# Patient Record
Sex: Male | Born: 1955 | Race: White | Hispanic: No | Marital: Married | Smoking: Former smoker
Health system: Southern US, Community
[De-identification: ages and names within clinical notes are randomized; demographics above are authoritative.]

## PROBLEM LIST (undated history)

## (undated) DIAGNOSIS — I10 Essential (primary) hypertension: Secondary | ICD-10-CM

## (undated) DIAGNOSIS — M1990 Unspecified osteoarthritis, unspecified site: Secondary | ICD-10-CM

## (undated) DIAGNOSIS — K219 Gastro-esophageal reflux disease without esophagitis: Secondary | ICD-10-CM

## (undated) HISTORY — PX: TONSILLECTOMY: SUR1361

## (undated) HISTORY — PX: JOINT REPLACEMENT: SHX530

---

## 1984-04-16 HISTORY — PX: TOE SURGERY: SHX1073

## 2015-05-26 ENCOUNTER — Other Ambulatory Visit: Payer: Self-pay | Admitting: Orthopedic Surgery

## 2015-05-26 DIAGNOSIS — M170 Bilateral primary osteoarthritis of knee: Secondary | ICD-10-CM

## 2015-06-02 ENCOUNTER — Ambulatory Visit
Admission: RE | Admit: 2015-06-02 | Discharge: 2015-06-02 | Disposition: A | Discharge status: Home / Self Care | Payer: BLUE CROSS/BLUE SHIELD | Source: Ambulatory Visit | Attending: Orthopedic Surgery | Admitting: Orthopedic Surgery

## 2015-06-02 DIAGNOSIS — M170 Bilateral primary osteoarthritis of knee: Secondary | ICD-10-CM

## 2015-06-02 DIAGNOSIS — M1711 Unilateral primary osteoarthritis, right knee: Secondary | ICD-10-CM | POA: Insufficient documentation

## 2015-07-06 ENCOUNTER — Encounter
Admission: RE | Admit: 2015-07-06 | Discharge: 2015-07-06 | Disposition: A | Discharge status: Home / Self Care | Payer: BLUE CROSS/BLUE SHIELD | Source: Ambulatory Visit | Attending: Orthopedic Surgery | Admitting: Orthopedic Surgery

## 2015-07-06 DIAGNOSIS — Z01812 Encounter for preprocedural laboratory examination: Secondary | ICD-10-CM | POA: Diagnosis not present

## 2015-07-06 DIAGNOSIS — Z01810 Encounter for preprocedural cardiovascular examination: Secondary | ICD-10-CM | POA: Insufficient documentation

## 2015-07-06 LAB — BASIC METABOLIC PANEL
Anion gap: 9 (ref 5–15)
BUN: 13 mg/dL (ref 6–20)
CALCIUM: 9.1 mg/dL (ref 8.9–10.3)
CO2: 24 mmol/L (ref 22–32)
CREATININE: 1.22 mg/dL (ref 0.61–1.24)
Chloride: 107 mmol/L (ref 101–111)
GFR calc Af Amer: >60 mL/min (ref >60)
Glucose, Bld: 114 mg/dL — ABNORMAL HIGH (ref 65–99)
POTASSIUM: 3.6 mmol/L (ref 3.5–5.1)
SODIUM: 140 mmol/L (ref 135–145)

## 2015-07-06 LAB — URINALYSIS COMPLETE WITH MICROSCOPIC (ARMC ONLY)
BACTERIA UA: NONE SEEN
Bilirubin Urine: NEGATIVE
Glucose, UA: NEGATIVE mg/dL
HGB URINE DIPSTICK: NEGATIVE
Leukocytes, UA: NEGATIVE
Nitrite: NEGATIVE
PH: 5 (ref 5.0–8.0)
PROTEIN: NEGATIVE mg/dL
SQUAMOUS EPITHELIAL / LPF: NONE SEEN
Specific Gravity, Urine: 1.025 (ref 1.005–1.030)

## 2015-07-06 LAB — CBC
HCT: 43.2 % (ref 40.0–52.0)
HEMOGLOBIN: 14.2 g/dL (ref 13.0–18.0)
MCH: 27.9 pg (ref 26.0–34.0)
MCHC: 32.8 g/dL (ref 32.0–36.0)
MCV: 85 fL (ref 80.0–100.0)
PLATELETS: 253 10*3/uL (ref 150–440)
RBC: 5.08 MIL/uL (ref 4.40–5.90)
RDW: 12.9 % (ref 11.5–14.5)
WBC: 7.2 10*3/uL (ref 3.8–10.6)

## 2015-07-06 LAB — TYPE AND SCREEN
ABO/RH(D): O NEG
ANTIBODY SCREEN: NEGATIVE

## 2015-07-06 LAB — APTT: aPTT: 31 seconds (ref 24–36)

## 2015-07-06 LAB — SURGICAL PCR SCREEN
MRSA, PCR: NEGATIVE
STAPHYLOCOCCUS AUREUS: POSITIVE — AB

## 2015-07-06 LAB — SEDIMENTATION RATE: SED RATE: 4 mm/h (ref 0–20)

## 2015-07-06 LAB — PROTIME-INR
INR: 0.96
PROTHROMBIN TIME: 13 s (ref 11.4–15.0)

## 2015-07-06 NOTE — Patient Instructions (Signed)
  Your procedure is scheduled on: Tuesday Sept. 6, 2016 Report to Same Day Surgery. To find out your arrival time please call [phone] between 1PM - 3PM on Friday Sept. 2, 2016.  Remember: Instructions that are not followed completely may result in serious medical risk, up to and including death, or upon the discretion of your surgeon and anesthesiologist your surgery may need to be rescheduled.    _x___ 1. Do not eat food or drink liquids after midnight. No gum chewing or hard candies.     _x___ 2. No Alcohol for 24 hours before or after surgery.   ____ 3. Bring all medications with you on the day of surgery if instructed.    _x___ 4. Notify your doctor if there is any change in your medical condition     (cold, fever, infections).     Do not wear jewelry, make-up, hairpins, clips or nail polish.  Do not wear lotions, powders, or perfumes. You may wear deodorant.  Do not shave 48 hours prior to surgery. Men may shave face and neck.  Do not bring valuables to the hospital.    [facility] is not responsible for any belongings or valuables.               Contacts, dentures or bridgework may not be worn into surgery.  Leave your suitcase in the car. After surgery it may be brought to your room.  For patients admitted to the hospital, discharge time is determined by your treatment team.   Patients discharged the day of surgery will not be allowed to drive home.    Please read over the following fact sheets that you were given:   [facility] Preparing for Surgery  __x__ Take these medicines the morning of surgery with A SIP OF WATER:    1. amLODipine (NORVASC   ____ Fleet Enema (as directed)   _x___ Use CHG Soap as directed  ____ Use inhalers on the day of surgery  ____ Stop metformin 2 days prior to surgery    ____ Take 1/2 of usual insulin dose the night before surgery and none on the morning of surgery.   ____ Stop Coumadin/Plavix/aspirin on does not apply.  __x_  Stop Anti-inflammatories aleve now.  May continue taking Celebrex.   ____ Stop supplements until after surgery.    ____ Bring C-Pap to the hospital.

## 2015-07-07 LAB — ABO/RH: ABO/RH(D): O NEG

## 2015-07-07 NOTE — OR Nursing (Signed)
Postive staph faxed to dr Rosita Kea office

## 2015-07-08 LAB — URINE CULTURE: CULTURE: NO GROWTH

## 2015-07-12 ENCOUNTER — Inpatient Hospital Stay: Payer: BLUE CROSS/BLUE SHIELD | Admitting: Certified Registered Nurse Anesthetist

## 2015-07-12 ENCOUNTER — Inpatient Hospital Stay: Payer: BLUE CROSS/BLUE SHIELD

## 2015-07-12 ENCOUNTER — Encounter
Admission: RE | Disposition: A | Discharge status: Home Health | Payer: Self-pay | Source: Ambulatory Visit | Attending: Orthopedic Surgery

## 2015-07-12 ENCOUNTER — Inpatient Hospital Stay
Admission: RE | Admit: 2015-07-12 | Discharge: 2015-07-15 | DRG: 470 | Disposition: A | Discharge status: Home Health | Payer: BLUE CROSS/BLUE SHIELD | Source: Ambulatory Visit | Attending: Orthopedic Surgery | Admitting: Orthopedic Surgery

## 2015-07-12 DIAGNOSIS — R531 Weakness: Secondary | ICD-10-CM | POA: Diagnosis not present

## 2015-07-12 DIAGNOSIS — Y92239 Unspecified place in hospital as the place of occurrence of the external cause: Secondary | ICD-10-CM

## 2015-07-12 DIAGNOSIS — T4145XA Adverse effect of unspecified anesthetic, initial encounter: Secondary | ICD-10-CM | POA: Diagnosis not present

## 2015-07-12 DIAGNOSIS — I10 Essential (primary) hypertension: Secondary | ICD-10-CM | POA: Diagnosis present

## 2015-07-12 DIAGNOSIS — I959 Hypotension, unspecified: Secondary | ICD-10-CM

## 2015-07-12 DIAGNOSIS — R61 Generalized hyperhidrosis: Secondary | ICD-10-CM | POA: Diagnosis not present

## 2015-07-12 DIAGNOSIS — K219 Gastro-esophageal reflux disease without esophagitis: Secondary | ICD-10-CM | POA: Diagnosis present

## 2015-07-12 DIAGNOSIS — G8918 Other acute postprocedural pain: Secondary | ICD-10-CM

## 2015-07-12 DIAGNOSIS — M1710 Unilateral primary osteoarthritis, unspecified knee: Secondary | ICD-10-CM | POA: Diagnosis present

## 2015-07-12 DIAGNOSIS — I952 Hypotension due to drugs: Secondary | ICD-10-CM | POA: Diagnosis not present

## 2015-07-12 DIAGNOSIS — Z79899 Other long term (current) drug therapy: Secondary | ICD-10-CM | POA: Diagnosis not present

## 2015-07-12 DIAGNOSIS — M1711 Unilateral primary osteoarthritis, right knee: Principal | ICD-10-CM | POA: Diagnosis present

## 2015-07-12 HISTORY — PX: TOTAL KNEE ARTHROPLASTY: SHX125

## 2015-07-12 HISTORY — DX: Essential (primary) hypertension: I10

## 2015-07-12 HISTORY — DX: Unspecified osteoarthritis, unspecified site: M19.90

## 2015-07-12 HISTORY — DX: Gastro-esophageal reflux disease without esophagitis: K21.9

## 2015-07-12 LAB — CBC
HEMATOCRIT: 39.4 % — AB (ref 40.0–52.0)
HEMOGLOBIN: 13.3 g/dL (ref 13.0–18.0)
MCH: 29.1 pg (ref 26.0–34.0)
MCHC: 33.8 g/dL (ref 32.0–36.0)
MCV: 86.1 fL (ref 80.0–100.0)
Platelets: 219 10*3/uL (ref 150–440)
RBC: 4.57 MIL/uL (ref 4.40–5.90)
RDW: 13.3 % (ref 11.5–14.5)
WBC: 11.3 10*3/uL — AB (ref 3.8–10.6)

## 2015-07-12 LAB — CREATININE, SERUM
Creatinine, Ser: 0.99 mg/dL (ref 0.61–1.24)
GFR calc non Af Amer: >60 mL/min (ref >60)

## 2015-07-12 SURGERY — ARTHROPLASTY, KNEE, TOTAL
Anesthesia: Spinal | Site: Knee | Laterality: Right | Wound class: Clean

## 2015-07-12 MED ORDER — CEFAZOLIN SODIUM-DEXTROSE 2-3 GM-% IV SOLR
INTRAVENOUS | Status: AC
  Filled 2015-07-12: qty 50

## 2015-07-12 MED ORDER — DIPHENHYDRAMINE HCL 12.5 MG/5ML PO ELIX: 12.5000 mg | ORAL_SOLUTION | ORAL | Status: DC | PRN

## 2015-07-12 MED ORDER — NEOMYCIN-POLYMYXIN B GU 40-200000 IR SOLN
Status: AC
  Filled 2015-07-12: qty 12

## 2015-07-12 MED ORDER — FAMOTIDINE 20 MG PO TABS
20.0000 mg | ORAL_TABLET | Freq: Once | ORAL | Status: AC
  Administered 2015-07-12: 20 mg via ORAL

## 2015-07-12 MED ORDER — CELECOXIB 200 MG PO CAPS
200.0000 mg | ORAL_CAPSULE | Freq: Every day | ORAL | Status: DC
  Administered 2015-07-13 – 2015-07-15 (×3): 200 mg via ORAL
  Filled 2015-07-12 (×3): qty 1

## 2015-07-12 MED ORDER — PROPOFOL INFUSION 10 MG/ML OPTIME
INTRAVENOUS | Status: DC | PRN
  Administered 2015-07-12: 100 ug/kg/min via INTRAVENOUS

## 2015-07-12 MED ORDER — KETOROLAC TROMETHAMINE 0.5 % OP SOLN
1.0000 [drp] | Freq: Once | OPHTHALMIC | Status: AC
  Administered 2015-07-12: 1 [drp] via OPHTHALMIC

## 2015-07-12 MED ORDER — FENTANYL CITRATE (PF) 100 MCG/2ML IJ SOLN: 25.0000 ug | INTRAMUSCULAR | Status: DC | PRN

## 2015-07-12 MED ORDER — SODIUM CHLORIDE 0.9 % IV BOLUS (SEPSIS)
1000.0000 mL | Freq: Once | INTRAVENOUS | Status: AC
  Administered 2015-07-12: 1000 mL via INTRAVENOUS

## 2015-07-12 MED ORDER — BUPIVACAINE LIPOSOME 1.3 % IJ SUSP
INTRAMUSCULAR | Status: AC
  Filled 2015-07-12: qty 20

## 2015-07-12 MED ORDER — SODIUM CHLORIDE 0.9 % IV SOLN
INTRAVENOUS | Status: DC
  Administered 2015-07-12: 12:00:00 via INTRAVENOUS

## 2015-07-12 MED ORDER — MORPHINE SULFATE (PF) 10 MG/ML IV SOLN
INTRAVENOUS | Status: AC
  Filled 2015-07-12: qty 1

## 2015-07-12 MED ORDER — TETRACAINE HCL 1 % IJ SOLN
INTRAMUSCULAR | Status: DC | PRN
  Administered 2015-07-12: 5 mg via INTRASPINAL

## 2015-07-12 MED ORDER — BUPIVACAINE HCL (PF) 0.5 % IJ SOLN
INTRAMUSCULAR | Status: DC | PRN
Start: 2015-07-12 — End: 2015-07-12
  Administered 2015-07-12: 3 mL

## 2015-07-12 MED ORDER — CEFAZOLIN SODIUM-DEXTROSE 2-3 GM-% IV SOLR
2.0000 g | Freq: Once | INTRAVENOUS | Status: AC
  Administered 2015-07-12: 2 g via INTRAVENOUS

## 2015-07-12 MED ORDER — ONDANSETRON HCL 4 MG/2ML IJ SOLN: 4.0000 mg | Freq: Four times a day (QID) | INTRAMUSCULAR | Status: DC | PRN

## 2015-07-12 MED ORDER — BUPIVACAINE HCL (PF) 0.5 % IJ SOLN
INTRAMUSCULAR | Status: AC
  Filled 2015-07-12: qty 30

## 2015-07-12 MED ORDER — NEOMYCIN-POLYMYXIN B GU 40-200000 IR SOLN
Status: DC | PRN
  Administered 2015-07-12: 16 mL

## 2015-07-12 MED ORDER — SODIUM CHLORIDE 0.9 % IV SOLN
INTRAVENOUS | Status: DC
  Administered 2015-07-12 – 2015-07-13 (×2): via INTRAVENOUS

## 2015-07-12 MED ORDER — MAGNESIUM CITRATE PO SOLN: 1.0000 | Freq: Once | ORAL | Status: DC | PRN

## 2015-07-12 MED ORDER — MENTHOL 3 MG MT LOZG: 1.0000 | LOZENGE | OROMUCOSAL | Status: DC | PRN

## 2015-07-12 MED ORDER — FENTANYL CITRATE (PF) 100 MCG/2ML IJ SOLN
INTRAMUSCULAR | Status: DC | PRN
  Administered 2015-07-12 (×2): 50 ug via INTRAVENOUS

## 2015-07-12 MED ORDER — CEFAZOLIN SODIUM-DEXTROSE 2-3 GM-% IV SOLR
2.0000 g | Freq: Four times a day (QID) | INTRAVENOUS | Status: AC
  Administered 2015-07-12 (×3): 2 g via INTRAVENOUS
  Filled 2015-07-12 (×3): qty 50

## 2015-07-12 MED ORDER — BISACODYL 10 MG RE SUPP: 10.0000 mg | Freq: Every day | RECTAL | Status: DC | PRN

## 2015-07-12 MED ORDER — FAMOTIDINE 20 MG PO TABS
ORAL_TABLET | ORAL | Status: AC
  Filled 2015-07-12: qty 1

## 2015-07-12 MED ORDER — ENOXAPARIN SODIUM 30 MG/0.3ML ~~LOC~~ SOLN
30.0000 mg | Freq: Two times a day (BID) | SUBCUTANEOUS | Status: DC
Start: 2015-07-13 — End: 2015-07-15
  Administered 2015-07-13 – 2015-07-15 (×5): 30 mg via SUBCUTANEOUS
  Filled 2015-07-12 (×5): qty 0.3

## 2015-07-12 MED ORDER — SODIUM CHLORIDE 0.9 % IJ SOLN
INTRAMUSCULAR | Status: AC
  Filled 2015-07-12: qty 50

## 2015-07-12 MED ORDER — ACETAMINOPHEN 325 MG PO TABS: 650.0000 mg | ORAL_TABLET | Freq: Four times a day (QID) | ORAL | Status: DC | PRN

## 2015-07-12 MED ORDER — SODIUM CHLORIDE 0.9 % IV SOLN
1000.0000 mg | INTRAVENOUS | Status: AC
  Administered 2015-07-12: 1000 mg via INTRAVENOUS
  Filled 2015-07-12 (×2): qty 10

## 2015-07-12 MED ORDER — SODIUM CHLORIDE 0.9 % IR SOLN
Status: DC | PRN
  Administered 2015-07-12: 3000 mL

## 2015-07-12 MED ORDER — ONDANSETRON HCL 4 MG/2ML IJ SOLN: 4.0000 mg | Freq: Once | INTRAMUSCULAR | Status: DC | PRN

## 2015-07-12 MED ORDER — MORPHINE (PF) INJECTION FOR INHALATION 10 MG/ML
RESPIRATORY_TRACT | Status: DC | PRN
  Administered 2015-07-12: 10 mg via RESPIRATORY_TRACT

## 2015-07-12 MED ORDER — BUPIVACAINE-EPINEPHRINE (PF) 0.5% -1:200000 IJ SOLN
INTRAMUSCULAR | Status: AC
  Filled 2015-07-12: qty 30

## 2015-07-12 MED ORDER — LACTATED RINGERS IV SOLN
INTRAVENOUS | Status: DC
  Administered 2015-07-12 (×2): via INTRAVENOUS

## 2015-07-12 MED ORDER — KETOROLAC TROMETHAMINE 30 MG/ML IJ SOLN
INTRAMUSCULAR | Status: DC | PRN
  Administered 2015-07-12: 30 mg via INTRAVENOUS

## 2015-07-12 MED ORDER — BUPIVACAINE-EPINEPHRINE (PF) 0.25% -1:200000 IJ SOLN
INTRAMUSCULAR | Status: DC | PRN
  Administered 2015-07-12: 30 mL via PERINEURAL

## 2015-07-12 MED ORDER — OXYCODONE HCL 5 MG PO TABS
5.0000 mg | ORAL_TABLET | ORAL | Status: DC | PRN
  Administered 2015-07-12: 5 mg via ORAL
  Administered 2015-07-12 (×2): 10 mg via ORAL
  Administered 2015-07-12: 5 mg via ORAL
  Administered 2015-07-13 – 2015-07-14 (×7): 10 mg via ORAL
  Filled 2015-07-12 (×3): qty 2
  Filled 2015-07-12 (×2): qty 1
  Filled 2015-07-12: qty 2
  Filled 2015-07-12: qty 1
  Filled 2015-07-12 (×5): qty 2

## 2015-07-12 MED ORDER — INFLUENZA VAC SPLIT QUAD 0.5 ML IM SUSY
0.5000 mL | PREFILLED_SYRINGE | INTRAMUSCULAR | Status: DC
  Filled 2015-07-12: qty 0.5

## 2015-07-12 MED ORDER — TETRACAINE HCL 1 % IJ SOLN
INTRAMUSCULAR | Status: AC
  Filled 2015-07-12: qty 2

## 2015-07-12 MED ORDER — MIDAZOLAM HCL 2 MG/2ML IJ SOLN
INTRAMUSCULAR | Status: DC | PRN
  Administered 2015-07-12: 2 mg via INTRAVENOUS

## 2015-07-12 MED ORDER — LIDOCAINE HCL (CARDIAC) 20 MG/ML IV SOLN
INTRAVENOUS | Status: DC | PRN
  Administered 2015-07-12: 40 mg via INTRAVENOUS

## 2015-07-12 MED ORDER — BUPIVACAINE LIPOSOME 1.3 % IJ SUSP
INTRAMUSCULAR | Status: DC | PRN
  Administered 2015-07-12: 20 mL

## 2015-07-12 MED ORDER — ACETAMINOPHEN 650 MG RE SUPP: 650.0000 mg | Freq: Four times a day (QID) | RECTAL | Status: DC | PRN

## 2015-07-12 MED ORDER — MORPHINE SULFATE (PF) 2 MG/ML IV SOLN
2.0000 mg | INTRAVENOUS | Status: DC | PRN
  Administered 2015-07-13: 2 mg via INTRAVENOUS
  Filled 2015-07-12: qty 1

## 2015-07-12 MED ORDER — ERYTHROMYCIN 5 MG/GM OP OINT
TOPICAL_OINTMENT | Freq: Two times a day (BID) | OPHTHALMIC | Status: DC
  Administered 2015-07-12 – 2015-07-15 (×6): 1 via OPHTHALMIC
  Filled 2015-07-12: qty 3.5

## 2015-07-12 MED ORDER — BUPIVACAINE-EPINEPHRINE (PF) 0.25% -1:200000 IJ SOLN
INTRAMUSCULAR | Status: AC
  Filled 2015-07-12: qty 30

## 2015-07-12 MED ORDER — AMLODIPINE BESYLATE 10 MG PO TABS
10.0000 mg | ORAL_TABLET | ORAL | Status: DC
  Administered 2015-07-13 – 2015-07-14 (×2): 10 mg via ORAL
  Filled 2015-07-12 (×3): qty 1

## 2015-07-12 MED ORDER — PHENOL 1.4 % MT LIQD: 1.0000 | OROMUCOSAL | Status: DC | PRN

## 2015-07-12 MED ORDER — ONDANSETRON HCL 4 MG PO TABS: 4.0000 mg | ORAL_TABLET | Freq: Four times a day (QID) | ORAL | Status: DC | PRN

## 2015-07-12 MED ORDER — DOCUSATE SODIUM 100 MG PO CAPS
100.0000 mg | ORAL_CAPSULE | Freq: Two times a day (BID) | ORAL | Status: DC
  Administered 2015-07-12 – 2015-07-15 (×6): 100 mg via ORAL
  Filled 2015-07-12 (×6): qty 1

## 2015-07-12 MED ORDER — MAGNESIUM HYDROXIDE 400 MG/5ML PO SUSP
30.0000 mL | Freq: Every day | ORAL | Status: DC | PRN
  Administered 2015-07-14: 30 mL via ORAL
  Filled 2015-07-12: qty 30

## 2015-07-12 MED ORDER — ACETAMINOPHEN 10 MG/ML IV SOLN
INTRAVENOUS | Status: DC | PRN
  Administered 2015-07-12: 1000 mg via INTRAVENOUS

## 2015-07-12 MED ORDER — METOCLOPRAMIDE HCL 5 MG PO TABS
5.0000 mg | ORAL_TABLET | Freq: Three times a day (TID) | ORAL | Status: DC | PRN
  Filled 2015-07-12: qty 2

## 2015-07-12 MED ORDER — METOCLOPRAMIDE HCL 5 MG/ML IJ SOLN: 5.0000 mg | Freq: Three times a day (TID) | INTRAMUSCULAR | Status: DC | PRN

## 2015-07-12 MED ORDER — KETOROLAC TROMETHAMINE 0.5 % OP SOLN
1.0000 [drp] | Freq: Two times a day (BID) | OPHTHALMIC | Status: DC
  Filled 2015-07-12: qty 3

## 2015-07-12 MED ORDER — ACETAMINOPHEN 10 MG/ML IV SOLN
INTRAVENOUS | Status: AC
  Filled 2015-07-12: qty 100

## 2015-07-12 SURGICAL SUPPLY — 52 items
BANDAGE ELASTIC 6 CLIP ST LF (GAUZE/BANDAGES/DRESSINGS) ×2 IMPLANT
BLADE SAW 1 (BLADE) ×2 IMPLANT
BLOCK CUTTING FEMUR 6 RT (MISCELLANEOUS) IMPLANT
BOWL CEMENT MIX W SPATULA BONE (MISCELLANEOUS) ×2 IMPLANT
CANISTER SUCT 1200ML W/VALVE (MISCELLANEOUS) ×2 IMPLANT
CANISTER SUCT 3000ML (MISCELLANEOUS) ×4 IMPLANT
CAPT KNEE TOTAL 3 ×2 IMPLANT
CATH FOL LEG HOLDER (MISCELLANEOUS) ×2 IMPLANT
CATH TRAY METER 16FR LF (MISCELLANEOUS) ×2 IMPLANT
CEMENT HV SMART SET (Cement) ×6 IMPLANT
CHLORAPREP W/TINT 26ML (MISCELLANEOUS) ×4 IMPLANT
COOLER POLAR GLACIER W/PUMP (MISCELLANEOUS) ×2 IMPLANT
DRAPE INCISE IOBAN 66X45 STRL (DRAPES) ×4 IMPLANT
DRAPE SHEET LG 3/4 BI-LAMINATE (DRAPES) ×4 IMPLANT
ELECT CAUTERY BLADE 6.4 (BLADE) ×2 IMPLANT
GAUZE PETRO XEROFOAM 1X8 (MISCELLANEOUS) ×2 IMPLANT
GAUZE SPONGE 4X4 12PLY STRL (GAUZE/BANDAGES/DRESSINGS) ×2 IMPLANT
GLOVE BIOGEL PI IND STRL 9 (GLOVE) ×1 IMPLANT
GLOVE BIOGEL PI INDICATOR 9 (GLOVE) ×1
GLOVE SURG ORTHO 9.0 STRL STRW (GLOVE) ×4 IMPLANT
GOWN SPECIALTY ULTRA XL (MISCELLANEOUS) ×2 IMPLANT
GOWN STRL REUS W/ TWL LRG LVL3 (GOWN DISPOSABLE) ×2 IMPLANT
GOWN STRL REUS W/TWL LRG LVL3 (GOWN DISPOSABLE) ×2
HANDPIECE SUCTION TUBG SURGILV (MISCELLANEOUS) ×2 IMPLANT
HOOD PEEL AWAY FACE SHEILD DIS (HOOD) ×4 IMPLANT
IMMBOLIZER KNEE 19 BLUE UNIV (SOFTGOODS) ×2 IMPLANT
IV SET EXTENSION 6 LL TADAPT (SET/KITS/TRAYS/PACK) ×2 IMPLANT
KNEE MEDACTA TIBIAL/FEMORAL BL (Knees) ×2 IMPLANT
KNIFE SCULPS 14X20 (INSTRUMENTS) ×2 IMPLANT
NDL SAFETY 18GX1.5 (NEEDLE) ×2 IMPLANT
NEEDLE SPNL 18GX3.5 QUINCKE PK (NEEDLE) ×4 IMPLANT
NEEDLE SPNL 20GX3.5 QUINCKE YW (NEEDLE) ×2 IMPLANT
NS IRRIG 1000ML POUR BTL (IV SOLUTION) ×2 IMPLANT
PACK TOTAL KNEE (MISCELLANEOUS) ×2 IMPLANT
PAD GROUND ADULT SPLIT (MISCELLANEOUS) ×2 IMPLANT
PAD WRAPON POLAR KNEE (MISCELLANEOUS) ×1 IMPLANT
PATELLA SZ4 CEMENTED (Joint) IMPLANT
SOL .9 NS 3000ML IRR  AL (IV SOLUTION) ×1
SOL .9 NS 3000ML IRR UROMATIC (IV SOLUTION) ×1 IMPLANT
STAPLER SKIN PROX 35W (STAPLE) ×2 IMPLANT
STRAP SAFETY BODY (MISCELLANEOUS) ×2 IMPLANT
SUCTION FRAZIER TIP 10 FR DISP (SUCTIONS) ×2 IMPLANT
SUT DVC 2 QUILL PDO  T11 36X36 (SUTURE) ×1
SUT DVC 2 QUILL PDO T11 36X36 (SUTURE) ×1 IMPLANT
SUT DVC QUILL MONODERM 30X30 (SUTURE) ×2 IMPLANT
SUT ETHIBOND NAB CT1 #1 30IN (SUTURE) ×2 IMPLANT
SYR 20CC LL (SYRINGE) ×2 IMPLANT
SYR 50ML LL SCALE MARK (SYRINGE) ×2 IMPLANT
TIBIAL CUTTING BLOCK SZ 6R IMPLANT
TOWER CARTRIDGE SMART MIX (DISPOSABLE) ×2 IMPLANT
WATER STERILE IRR 1000ML POUR (IV SOLUTION) ×2 IMPLANT
WRAPON POLAR PAD KNEE (MISCELLANEOUS) ×2

## 2015-07-12 NOTE — Care Management (Signed)
Patient is from [place] and wife states she has list of home health providers to share with this RNCM . RNCM will continue to follow.

## 2015-07-12 NOTE — Plan of Care (Signed)
Problem: Consults Goal: Diagnosis- Total Joint Replacement Outcome: Completed/Met Date Met:  07/12/15 Primary Total Knee

## 2015-07-12 NOTE — Consult Note (Addendum)
[facility] - [place] at [facility]   PATIENT NAME: Johnathan Salas    MR#:  [id number]  DATE OF BIRTH:  1956-03-10  DATE OF ADMISSION:  07/12/2015  PRIMARY CARE PHYSICIAN: Eldridge Abrahams, MD   REQUESTING/REFERRING PHYSICIAN: Dr Rosita Kea  CHIEF COMPLAINT:  Lethargic weakness and low blood pressure.  HISTORY OF PRESENT ILLNESS:  Johnathan Salas  is a 59 y.o. male with a known history of hypertension, osteoarthritis is post op right knee replacement which was elective surgery done by Dr. Rosita Kea. Patient had surgery done under spinal. He was talking to the nurse all of a sudden became diaphoretic and weak and lethargic and slumped over. Rapid response was called. Patient's blood pressure was down in the 90s. Currently he is alert and oriented 3. His blood pressure systolic is 110. He is getting a bolus of IV fluid. Denies any chest pain shortness of breath any focal weakness. He is able to swallow no blurred vision. He has pain or the operative knee. Patient also received some morphine in recovery.  PAST MEDICAL HISTORY:   Past Medical History  Diagnosis Date  . Hypertension   . GERD (gastroesophageal reflux disease)   . Arthritis     PAST SURGICAL HISTOIRY:   Past Surgical History  Procedure Laterality Date  . Toe surgery Left 04/16/1984    SOCIAL HISTORY:   Social History  Substance Use Topics  . Smoking status: Never Smoker   . Smokeless tobacco: Not on file  . Alcohol Use: 1.2 oz/week    2 Cans of beer per week    FAMILY HISTORY:   Family History  Problem Relation Age of Onset  . Hypertension Mother   . Stomach cancer Father   . Throat cancer Father     DRUG ALLERGIES:  No Known Allergies  REVIEW OF SYSTEMS:   Review of Systems  Constitutional: Positive for diaphoresis. Negative for fever, chills and weight loss.  HENT: Negative for ear discharge, ear pain and nosebleeds.   Eyes: Negative for blurred vision, pain and discharge.   Respiratory: Negative for sputum production, shortness of breath, wheezing and stridor.   Cardiovascular: Negative for chest pain, palpitations, orthopnea and PND.  Gastrointestinal: Negative for nausea, vomiting, abdominal pain and diarrhea.  Genitourinary: Negative for urgency and frequency.  Musculoskeletal: Positive for joint pain. Negative for back pain.  Neurological: Positive for weakness. Negative for sensory change, speech change and focal weakness.  Psychiatric/Behavioral: Negative for depression. The patient is not nervous/anxious.   All other systems reviewed and are negative.  MEDICATIONS AT HOME:   Prior to Admission medications   Medication Sig Start Date End Date Taking? Authorizing Provider  amLODipine (NORVASC) 10 MG tablet Take 10 mg by mouth every morning.   Yes Historical Provider, MD  celecoxib (CELEBREX) 200 MG capsule Take 200 mg by mouth daily.   Yes Historical Provider, MD      VITAL SIGNS:  Blood pressure 112/61, pulse 61, temperature 97.3 F (36.3 C), temperature source Axillary, resp. rate 18, height 6\' 3"  (1.905 m), weight 113.399 kg (250 lb), SpO2 100 %.  PHYSICAL EXAMINATION:  GENERAL:  59 y.o.-year-old patient lying in the bed with no acute distress.  EYES: Pupils equal, round, reactive to light and accommodation. No scleral icterus. Extraocular muscles intact.  HEENT: Head atraumatic, normocephalic. Oropharynx and nasopharynx clear.  NECK:  Supple, no jugular venous distention. No thyroid enlargement, no tenderness.  LUNGS: Normal breath sounds bilaterally, no wheezing, rales,rhonchi or crepitation. No use  of accessory muscles of respiration.  CARDIOVASCULAR: S1, S2 normal. No murmurs, rubs, or gallops.  ABDOMEN: Soft, nontender, nondistended. Bowel sounds present. No organomegaly or mass.  EXTREMITIES: No pedal edema, cyanosis, or clubbing. Right knee brace + NEUROLOGIC: Cranial nerves II through XII are intact. Muscle strength 5/5 in all  extremities. Sensation intact. Gait not checked.  PSYCHIATRIC: The patient is alert and oriented x 3.  SKIN: No obvious rash, lesion, or ulcer.   LABORATORY PANEL:   CBC  Recent Labs Lab 07/12/15 1318  WBC 11.3*  HGB 13.3  HCT 39.4*  PLT 219   ------------------------------------------------------------------------------------------------------------------  Chemistries   Recent Labs Lab 07/06/15 1449 07/12/15 1318  NA 140  --   K 3.6  --   CL 107  --   CO2 24  --   GLUCOSE 114*  --   BUN 13  --   CREATININE 1.22 0.99  CALCIUM 9.1  --    ------------------------------------------------------------------------------------------------------------------  Cardiac Enzymes No results for input(s): TROPONINI in the last 168 hours. ------------------------------------------------------------------------------------------------------------------  RADIOLOGY:  Dg Knee 1-2 Views Right  07/12/2015   CLINICAL DATA:  Status post total knee replacement  EXAM: RIGHT KNEE - 1-2 VIEW  COMPARISON:  CT right knee June 02, 2015  FINDINGS: Frontal and lateral views were obtained. Patient is status post total knee replacement with femoral and tibial prosthetic components appearing well-seated. No fracture or dislocation. Air within the joint is an expected postoperative finding.  IMPRESSION: Prosthetic components appear well seated. No acute fracture or dislocation.   Electronically Signed   By: Bretta Bang III M.D.   On: 07/12/2015 11:16    EKG:   S bradycardia. No acute ST-T changes  IMPRESSION AND PLAN:   59 year old Johnathan Salas with past medical history of hypertension osteoarthritis is postop right knee replacement. Internal medicine was consulted for  1. Acute hypotension, lethargy, weakness and diaphoresis suspected due to low blood pressure in the setting of spinal anesthesia and IV pain medication postop right total knee replacement. Patient had elective surgery today by Dr.  Rosita Kea.   continue IV fluids for hydration.   blood pressure is much improved after IV bolus. Patient asymptomatic EKG shows sinus bradycardia and no acute ST changes. We'll continue telemetry monitoring. Patient is alert oriented 3 no neuro deficit. Hold off on CT head.  2. Right knee elective replacement today by Dr. Rosita Kea When necessary pain meds Physical therapy when appropriate  All the records are reviewed and case discussed with Consulting provider. Management plans discussed with the patient, family and they are in agreement.  CODE STATUS: FULL  TOTAL TIME TAKING CARE OF THIS PATIENT: 45 minutes.    Johnathan Salas M.D on 07/12/2015 at 4:14 PM  Between 7am to 6pm - Pager - [phone]  After 6pm go to [email or web address] - password [email or web address]  [facility]  Office  [phone]  CC: Primary care Physician: Eldridge Abrahams, MD

## 2015-07-12 NOTE — Op Note (Signed)
07/12/2015  10:42 AM  PATIENT:  Johnathan Salas  59 y.o. male  PRE-OPERATIVE DIAGNOSIS:  Osteoarthritis Right Knee  POST-OPERATIVE DIAGNOSIS:  Osteoarthritis Right Knee  PROCEDURE:  Procedure(s): TOTAL KNEE ARTHROPLASTY (Right)  SURGEON: Leitha Schuller, MD  ASSISTANTS: None  ANESTHESIA:   spinal  EBL:  Total I/O In: 1500 [I.V.:1500] Out: 400 [Urine:300; Blood:100]  BLOOD ADMINISTERED:none  DRAINS: none   LOCAL MEDICATIONS USED:  MARCAINE    and OTHER Toradol morphine and Exparel  SPECIMEN:  Source of Specimen:  Cut ends of bone right knee  DISPOSITION OF SPECIMEN:  PATHOLOGY  COUNTS:  YES  TOURNIQUET:   114 minutes at 300 mmHg  IMPLANTS: Medacta GMK sphere 6 femur 6 tibia with 10 mm insert and 3 patella all components cemented  DICTATION: .Dragon Dictation patient was brought to the operating room and after adequate spinal anesthesia was obtained the right leg was prepped and draped in sterile fashion. After patient identification and timeout procedures were completed tourniquet was raised at a midline skin incision was made followed by medial parapatellar arthrotomy. Inspection revealed eburnated bone in the medial compartment with extensive bone loss on the femoral condyle as well as his eburnated bone in the femoral trochlea and on the patella the lateral compartment had relatively minor degenerative changes. The anterior cruciate ligament and fat pad were excised and subsequently the PCL. Medial capsule was elevated to expose the medial tibia for application of the proximal tibia cutting guide from the my knee system. Proximal tibia cut was carried out and followed by the distal femoral cut in a similar fashion. Again extension was checked and was adequate for implants. Next the's 4-in-1 cutting guide was applied to the distal femur and the anterior posterior and chamfer cuts carried out. This point the posterior horn of the menisci could be excised and the joint was quite  stiff and was a some difficulty but it wasn't was there was adequate mobilization the soft tissues to removed some posterior osteophytes and loose bodies. Mainly behind the medial femoral condyle. The femoral trochlear cut was carried out at this time with the cutting jig. An trials placed with the keel punch proximal control and then keel punch placed on the tibial side. Some drill holes were also socially made in the medial tibial side since the bone was quite sclerotic to aid in cement fixation. Following this the patella was cut with the cutting guide 3 drill holes placed and subsequently a size 3 patella was appropriate. This point the bony surfaces thoroughly irrigated and dried and the tibial and femoral components cemented into place with the 10 mm insert the knee is held in extension. The room was warm and by the time these components were set the cement had hardened and a second batch needed to be mixed for the patella patella button was clamped into place and the knee irrigated with Betadine solution as the set. Tourniquet was let down at this time as well secondary to it being close to 2 hours. Patella tracked well with no touch technique and X and after excess cemented been removed there is appropriate stability to the knee as well. The knee was again irrigated with pulsatile lavage and then closed with a heavy Quill for the parapatellar arthrotomy,2-0 Quill for the subcutaneous tissue and then skin staples. Xeroform 4 x 4's web roll ABDs Polar Care and Ace wrap were applied. Patient center comes stable condition PLAN OF CARE: Admit to inpatient   PATIENT DISPOSITION:  PACU - hemodynamically stable.

## 2015-07-12 NOTE — H&P (Signed)
Reviewed paper H+P, will be scanned into chart. No changes noted.  

## 2015-07-12 NOTE — Anesthesia Preprocedure Evaluation (Addendum)
Anesthesia Evaluation  Patient identified by MRN, date of birth, ID band Patient awake    Reviewed: Allergy & Precautions, NPO status , Patient's Chart, lab work & pertinent test results  Airway Mallampati: II  TM Distance: >3 FB Neck ROM: Full    Dental no notable dental hx.    Pulmonary neg pulmonary ROS,    Pulmonary exam normal       Cardiovascular hypertension, Pt. on medications Normal cardiovascular exam    Neuro/Psych negative neurological ROS  negative psych ROS   GI/Hepatic Neg liver ROS, GERD-  Medicated and Controlled,  Endo/Other  negative endocrine ROS  Renal/GU negative Renal ROS  negative genitourinary   Musculoskeletal  (+) Arthritis -, Osteoarthritis,    Abdominal Normal abdominal exam  (+)   Peds negative pediatric ROS (+)  Hematology negative hematology ROS (+)   Anesthesia Other Findings   Reproductive/Obstetrics                            Anesthesia Physical Anesthesia Plan  ASA: II  Anesthesia Plan: Spinal   Post-op Pain Management:    Induction: Intravenous  Airway Management Planned: Nasal Cannula  Additional Equipment:   Intra-op Plan:   Post-operative Plan:   Informed Consent: I have reviewed the patients History and Physical, chart, labs and discussed the procedure including the risks, benefits and alternatives for the proposed anesthesia with the patient or authorized representative who has indicated his/her understanding and acceptance.   Dental advisory given  Plan Discussed with: CRNA and Surgeon  Anesthesia Plan Comments:         Anesthesia Quick Evaluation

## 2015-07-12 NOTE — Progress Notes (Addendum)
RN WAS IN PT ROOM TALKING WITH HIM AND PHYSICAL TX STEPHANIE AND PT BECAME LETHARGIC AND UNABLE TO SPEAK ,SLUMPING OVER TO THE RIGHT SIDE .DIAPHORETIC. RAPID RESPONSE NOTIFIED

## 2015-07-12 NOTE — Anesthesia Procedure Notes (Addendum)
Procedure Name: MAC Performed by: Malva Cogan Pre-anesthesia Checklist: Patient identified, Emergency Drugs available, Suction available, Patient being monitored and Timeout performed Patient Re-evaluated:Patient Re-evaluated prior to inductionOxygen Delivery Method: Simple face mask    Spinal Patient location during procedure: OR Staffing Resident/CRNA: Malva Cogan Performed by: resident/CRNA  Preanesthetic Checklist Completed: patient identified, site marked, surgical consent, pre-op evaluation, timeout performed, IV checked, risks and benefits discussed and monitors and equipment checked Spinal Block Patient position: sitting Prep: Betadine Patient monitoring: heart rate, cardiac monitor, continuous pulse ox and blood pressure Approach: midline Location: L3-4 Injection technique: single-shot Needle Needle type: Whitacre  Needle gauge: 25 G Additional Notes CSF flow, negative paresthesia.  Dr. Noralyn Pick at bedside throughout procedure.  Spinal

## 2015-07-12 NOTE — Progress Notes (Signed)
PT Cancellation Note  Patient Details Name: Johnathan Salas MRN: [id number] DOB: Jan 09, 1956   Cancelled Treatment:    Reason Eval/Treat Not Completed:  (see PT cancellation note for further details). Upon evaluation attempt, pt lethargic and minimally responsive. RN in room. Will hold evaluation at this time and re-assess next date.   Hayes Rehfeldt 07/12/2015, 4:26 PM Elizabeth Palau, PT, DPT [phone]

## 2015-07-12 NOTE — OR Nursing (Signed)
Complains of right eye scratchy red eye, stated he mowed grass yesterday.  Eye is red.  Dr Noralyn Pick aware and has ordered Ketorlac drops for right eye.

## 2015-07-12 NOTE — Progress Notes (Addendum)
DR PATEL ON ROUNDS FOR CONSULT . PT ALERT AND TALKATIVE. PULSE UP TO 57. MD REPORTS APPLY OFF UNIT TELE MONITOR AND CONTINUE TO MONITOR PT. PTY TO RECEIVE BOLUS AT 999 OF NS,then ns at 100/hr

## 2015-07-12 NOTE — Transfer of Care (Signed)
Immediate Anesthesia Transfer of Care Note  Patient: Myrtle Barnhard  Procedure(s) Performed: Procedure(s): TOTAL KNEE ARTHROPLASTY (Right)  Patient Location: PACU  Anesthesia Type:Spinal  Level of Consciousness: awake, alert  and oriented  Airway & Oxygen Therapy: Patient Spontanous Breathing and Patient connected to face mask oxygen  Post-op Assessment: Report given to RN and Post -op Vital signs reviewed and stable  Post vital signs: Reviewed and stable  Last Vitals:  Filed Vitals:   07/12/15 1046  BP: 124/72  Pulse: 70  Temp: 36.1 C  Resp: 17    Complications: No apparent anesthesia complications

## 2015-07-13 ENCOUNTER — Encounter: Payer: Self-pay | Admitting: Orthopedic Surgery

## 2015-07-13 LAB — BASIC METABOLIC PANEL
Anion gap: 8 (ref 5–15)
BUN: 11 mg/dL (ref 6–20)
CALCIUM: 8 mg/dL — AB (ref 8.9–10.3)
CO2: 22 mmol/L (ref 22–32)
CREATININE: 1.09 mg/dL (ref 0.61–1.24)
Chloride: 107 mmol/L (ref 101–111)
GFR calc non Af Amer: >60 mL/min (ref >60)
Glucose, Bld: 133 mg/dL — ABNORMAL HIGH (ref 65–99)
Potassium: 3.8 mmol/L (ref 3.5–5.1)
SODIUM: 137 mmol/L (ref 135–145)

## 2015-07-13 LAB — CBC
HCT: 35.2 % — ABNORMAL LOW (ref 40.0–52.0)
Hemoglobin: 11.6 g/dL — ABNORMAL LOW (ref 13.0–18.0)
MCH: 28.1 pg (ref 26.0–34.0)
MCHC: 32.9 g/dL (ref 32.0–36.0)
MCV: 85.3 fL (ref 80.0–100.0)
Platelets: 205 10*3/uL (ref 150–440)
RBC: 4.12 MIL/uL — ABNORMAL LOW (ref 4.40–5.90)
RDW: 13 % (ref 11.5–14.5)
WBC: 9.9 10*3/uL (ref 3.8–10.6)

## 2015-07-13 LAB — GLUCOSE, CAPILLARY: GLUCOSE-CAPILLARY: 88 mg/dL (ref 65–99)

## 2015-07-13 MED ORDER — MUPIROCIN 2 % EX OINT
1.0000 "application " | TOPICAL_OINTMENT | Freq: Two times a day (BID) | CUTANEOUS | Status: DC
  Administered 2015-07-13 – 2015-07-15 (×5): 1 via NASAL
  Filled 2015-07-13 (×2): qty 22

## 2015-07-13 MED ORDER — CHLORHEXIDINE GLUCONATE CLOTH 2 % EX PADS
6.0000 | MEDICATED_PAD | Freq: Every day | CUTANEOUS | Status: DC
  Administered 2015-07-13 – 2015-07-15 (×3): 6 via TOPICAL

## 2015-07-13 NOTE — Progress Notes (Signed)
[facility] - [place] at [facility]   PATIENT NAME: Johnathan Salas    MR#:  [id number]  DATE OF BIRTH:  24-Dec-1955  SUBJECTIVE:  Doing well. Working with PT  REVIEW OF SYSTEMS:   Review of Systems  Constitutional: Negative for fever, chills and weight loss.  HENT: Negative for ear discharge, ear pain and nosebleeds.   Eyes: Negative for blurred vision, pain and discharge.  Respiratory: Negative for sputum production, shortness of breath, wheezing and stridor.   Cardiovascular: Negative for chest pain, palpitations, orthopnea and PND.  Gastrointestinal: Negative for nausea, vomiting, abdominal pain and diarrhea.  Genitourinary: Negative for urgency and frequency.  Musculoskeletal: Negative for back pain and joint pain.  Neurological: Negative for sensory change, speech change, focal weakness and weakness.  Psychiatric/Behavioral: Negative for depression. The patient is not nervous/anxious.   All other systems reviewed and are negative.  Tolerating Diet:yes Tolerating PT: HHPT  DRUG ALLERGIES:  No Known Allergies  VITALS:  Blood pressure 124/63, pulse 70, temperature 100 F (37.8 C), temperature source Oral, resp. rate 16, height 6\' 3"  (1.905 m), weight 113.399 kg (250 lb), SpO2 96 %.  PHYSICAL EXAMINATION:   Physical Exam  GENERAL:  59 y.o.-year-old patient lying in the bed with no acute distress.  EYES: Pupils equal, round, reactive to light and accommodation. No scleral icterus. Extraocular muscles intact.  HEENT: Head atraumatic, normocephalic. Oropharynx and nasopharynx clear.  NECK:  Supple, no jugular venous distention. No thyroid enlargement, no tenderness.  LUNGS: Normal breath sounds bilaterally, no wheezing, rales, rhonchi. No use of accessory muscles of respiration.  CARDIOVASCULAR: S1, S2 normal. No murmurs, rubs, or gallops.  ABDOMEN: Soft, nontender, nondistended. Bowel sounds present. No organomegaly or mass.  EXTREMITIES: No  cyanosis, clubbing or edema b/l.   Right knee brace+ NEUROLOGIC: Cranial nerves II through XII are intact. No focal Motor or sensory deficits b/l.   PSYCHIATRIC: The patient is alert and oriented x 3.  SKIN: No obvious rash, lesion, or ulcer.    LABORATORY PANEL:   CBC  Recent Labs Lab 07/13/15 0612  WBC 9.9  HGB 11.6*  HCT 35.2*  PLT 205    Chemistries   Recent Labs Lab 07/13/15 0612  NA 137  K 3.8  CL 107  CO2 22  GLUCOSE 133*  BUN 11  CREATININE 1.09  CALCIUM 8.0*    Cardiac Enzymes No results for input(s): TROPONINI in the last 168 hours.  RADIOLOGY:  Dg Knee 1-2 Views Right  07/12/2015   CLINICAL DATA:  Status post total knee replacement  EXAM: RIGHT KNEE - 1-2 VIEW  COMPARISON:  CT right knee June 02, 2015  FINDINGS: Frontal and lateral views were obtained. Patient is status post total knee replacement with femoral and tibial prosthetic components appearing well-seated. No fracture or dislocation. Air within the joint is an expected postoperative finding.  IMPRESSION: Prosthetic components appear well seated. No acute fracture or dislocation.   Electronically Signed   By: Bretta Bang III M.D.   On: 07/12/2015 11:16     ASSESSMENT AND PLAN:   59 year old Mr. Kimberley with past medical history of hypertension osteoarthritis is postop right knee replacement. Internal medicine was consulted for  1. Acute hypotension, lethargy, weakness and diaphoresis suspected due to low blood pressure in the setting of spinal anesthesia and IV pain medication postop right total knee replacement. Patient had elective surgery today by Dr. Rosita Kea.  BP much improved. Resumed his BP meds Eating well. Tolerating PT.  EKG shows sinus bradycardia and no acute ST changes. We'll continue telemetry monitoring. Patient is alert oriented 3 no neuro deficit.   2. Right knee elective replacement POD #1 by Dr. Rosita Kea When necessary pain meds Physical therapy recommends HHPT Ok tod/c  home from medical standpoint.  Case discussed with Care Management/Social Worker. Management plans discussed with the patient, family and they are in agreement.  CODE STATUS: full  DVT Prophylaxis: lovenox  TOTAL TIME TAKING CARE OF THIS PATIENT: 40 minutes.  >50% time spent on counselling and coordination of care  Jennfer Gassen M.D on 07/13/2015 at 11:56 AM  Between 7am to 6pm - Pager - [phone]  After 6pm go to [email or web address] - password [email or web address] [facility]  [facility]  Office  [phone]  CC: Primary care physician; Eldridge Abrahams, MD

## 2015-07-13 NOTE — Progress Notes (Signed)
Physical Therapy Treatment Patient Details Name: Johnathan Salas MRN: [id number] DOB: May 01, 1956 Today's Date: 07/13/2015    History of Present Illness This patient is a 59 year old male who came to [facility] for a R TKR.    PT Comments    Pt continues to be motivated to participate in therapy. Ambulating further this afternoon with relatively little pain. Gait performance is improving with cueing and pt is demonstrating better safety habits with transfers. Pt will continue to benefit from skilled PT in order to address his ROM, strength, and gait deficits so that he can return home safely.   Follow Up Recommendations  Home health PT;Supervision for mobility/OOB     Equipment Recommendations  None recommended by PT    Recommendations for Other Services       Precautions / Restrictions Precautions Precautions: Fall Restrictions Weight Bearing Restrictions: Yes Other Position/Activity Restrictions: WBAT    Mobility  Bed Mobility Overal bed mobility: Needs Assistance Bed Mobility: Supine to Sit     Supine to sit: Min assist     General bed mobility comments: Requires assist for LE to get into sitting. Good upper body strength to help himself up.  Transfers Overall transfer level: Needs assistance Equipment used: Rolling walker (2 wheeled) Transfers: Sit to/from Stand Sit to Stand: Min assist         General transfer comment:  (Appears stronger with stand this afternoon)  Ambulation/Gait Ambulation/Gait assistance: Min guard Ambulation Distance (Feet): 45 Feet Assistive device: Rolling walker (2 wheeled) Gait Pattern/deviations: Step-to pattern;Step-through pattern;Decreased step length - right;Decreased step length - left;Decreased stride length Gait velocity: decreased  Gait velocity interpretation: Below normal speed for age/gender General Gait Details:  (Able to demonstrate step-through with cueing this PM)   Stairs            Wheelchair Mobility     Modified Rankin (Stroke Patients Only)       Balance Overall balance assessment: No apparent balance deficits (not formally assessed)                                  Cognition Arousal/Alertness: Awake/alert Behavior During Therapy: WFL for tasks assessed/performed Overall Cognitive Status: Within Functional Limits for tasks assessed                      Exercises Other Exercises Other Exercises: Pt performed bilateral LE therex x 10 reps at supervision for proper techniques. Exercises performed: ankle pumps, quad sets, glute sets, SLR, hip abd/add, heel slides    General Comments        Pertinent Vitals/Pain Pain Assessment: 0-10 Pain Score: 3  Pain Location: R knee Pain Descriptors / Indicators: Aching Pain Intervention(s): Limited activity within patient's tolerance;Monitored during session;Premedicated before session;Ice applied    Home Living                      Prior Function            PT Goals (current goals can now be found in the care plan section) Acute Rehab PT Goals Patient Stated Goal: To walk some more PT Goal Formulation: With patient Time For Goal Achievement: 07/27/15 Potential to Achieve Goals: Good Progress towards PT goals: Progressing toward goals    Frequency  BID    PT Plan Current plan remains appropriate    Co-evaluation  End of Session Equipment Utilized During Treatment: Gait belt Activity Tolerance: Patient tolerated treatment well Patient left: in bed;with bed alarm set;with call bell/phone within reach;with family/visitor present;with SCD's reapplied     Time: 1406-1430 PT Time Calculation (min) (ACUTE ONLY): 24 min  Charges:                       G CodesBenna Dunks Aug 02, 2015, 4:41 PM Benna Dunks, SPT. [phone]

## 2015-07-13 NOTE — Anesthesia Postprocedure Evaluation (Signed)
  Anesthesia Post-op Note  Patient: Johnathan Salas  Procedure(s) Performed: Procedure(s): TOTAL KNEE ARTHROPLASTY (Right)  Anesthesia type:Spinal  Patient location: Floor  Post pain: Pain level controlled  Post assessment: Post-op Vital signs reviewed, Patient's Cardiovascular Status Stable, Respiratory Function Stable, Patent Airway and No signs of Nausea or vomiting  Post vital signs: Reviewed and stable  Last Vitals:  Filed Vitals:   07/13/15 0430  BP: 121/63  Pulse: 84  Temp: 37.6 C  Resp: 18    Level of consciousness: awake, alert  and patient cooperative  Complications: No apparent anesthesia complications

## 2015-07-13 NOTE — Evaluation (Signed)
Occupational Therapy Evaluation Patient Details Name: Johnathan Salas MRN: [id number] DOB: 1956/10/09 Today's Date: 07/13/2015    History of Present Illness This patient is a 59 year old male who came to [facility] for a R TKR.   Clinical Impression   This patient is a 59 year old male who came to [facility] for a R total knee replacement.  Patient lives in a 2 story home with his wife and can stay on one floor.  He had been independent with ADL and functional mobility and working full time. He now requires assistance and would benefit from Occupational Therapy for ADL/functioal mobility training.      Follow Up Recommendations       Equipment Recommendations       Recommendations for Other Services       Precautions / Restrictions Precautions Precautions: Fall Restrictions Weight Bearing Restrictions: Yes Other Position/Activity Restrictions: WBAT      Mobility Bed Mobility                  Transfers                      Balance                                            ADL                                         General ADL Comments: Had been independent. Patient then practiced lower body dressing. He attempted to dress with out assistive devices but was unable to manage clothing around right lower extremity. He used reacher and sock aid to donn and doff socks and reacher to donn pants. He needed contact guard assist and verbal cues for safety and technique.      Vision     Perception     Praxis      Pertinent Vitals/Pain Pain Assessment: 0-10 Pain Score: 3  Pain Location: R knee     Hand Dominance     Extremity/Trunk Assessment Upper Extremity Assessment Upper Extremity Assessment: Overall WFL for tasks assessed   Lower Extremity Assessment Lower Extremity Assessment: Defer to PT evaluation       Communication     Cognition Arousal/Alertness: Awake/alert Behavior During  Therapy: WFL for tasks assessed/performed Overall Cognitive Status: Within Functional Limits for tasks assessed                     General Comments       Exercises       Shoulder Instructions      Home Living Family/patient expects to be discharged to:: Private residence Living Arrangements: Spouse/significant other Available Help at Discharge: Family (2 sons, wife) Type of Home: House                                  Prior Functioning/Environment Level of Independence: Independent        Comments: works full time    OT Diagnosis: Acute pain   OT Problem List: Decreased activity tolerance   OT Treatment/Interventions: Self-care/ADL training    OT Goals(Current goals can be found in the care plan section)  Acute Rehab OT Goals Patient Stated Goal: To go home OT Goal Formulation: With patient/family Time For Goal Achievement: 07/27/15 Potential to Achieve Goals: Good  OT Frequency: Min 1X/week   Barriers to D/C:            Co-evaluation              End of Session Equipment Utilized During Treatment:  (Hip kit)  Activity Tolerance: Patient tolerated treatment well Patient left: in chair;with call bell/phone within reach;with chair alarm set;with family/visitor present   Time: 1038-1100 OT Time Calculation (min): 22 min Charges:  OT General Charges $OT Visit: 1 Procedure OT Evaluation $Initial OT Evaluation Tier I: 1 Procedure OT Treatments $Self Care/Home Management : 8-22 mins G-Codes:    Myrene Galas, MS/OTR/L  07/13/2015, 11:39 AM

## 2015-07-13 NOTE — Evaluation (Signed)
Physical Therapy Evaluation Patient Details Name: Johnathan Salas MRN: [id number] DOB: 07/30/1956 Today's Date: 07/13/2015   History of Present Illness  This patient is a 59 year old male who came to [facility] for a R TKR.  Clinical Impression  Pt presents with hx of HTN, GERD, and arthritis. Examination reveals that performs all mobility at min assist. Pt has good upper body strength and LLE strength to assist himself with mobility acts. He is very motivated to participate in therapy and has a good support system in his wife. Pt has strength, ROM, and gait deficits that will benefit from skilled PT in order for him to return home safely.     Follow Up Recommendations Home health PT;Supervision for mobility/OOB    Equipment Recommendations  None recommended by PT    Recommendations for Other Services       Precautions / Restrictions Precautions Precautions: Fall Restrictions Weight Bearing Restrictions: Yes Other Position/Activity Restrictions: WBAT      Mobility  Bed Mobility Overal bed mobility: Needs Assistance Bed Mobility: Supine to Sit     Supine to sit: Min assist     General bed mobility comments: Requires assist for LE to get into sitting. Good upper body strength to help himself up.  Transfers Overall transfer level: Needs assistance   Transfers: Sit to/from Stand Sit to Stand: Min assist         General transfer comment: Pt needs assist to help get into standing. Needs cueing to prop leg out as well for hand placement   Ambulation/Gait Ambulation/Gait assistance: Min assist Ambulation Distance (Feet): 10 Feet Assistive device: Rolling walker (2 wheeled) Gait Pattern/deviations: Step-to pattern;Decreased step length - right;Decreased step length - left;Decreased stride length;Shuffle Gait velocity: decreased    General Gait Details: Pt demonstrates step-to pattern and cues for sequencing of RW with gait pattern. No buckling noted during ambulation.    Stairs            Wheelchair Mobility    Modified Rankin (Stroke Patients Only)       Balance Overall balance assessment: No apparent balance deficits (not formally assessed)                                           Pertinent Vitals/Pain Pain Assessment: 0-10 Pain Score: 4  Pain Location: R knee Pain Intervention(s): Limited activity within patient's tolerance;Monitored during session;Premedicated before session;Ice applied    Home Living Family/patient expects to be discharged to:: Private residence Living Arrangements: Spouse/significant other Available Help at Discharge: Family;Available 24 hours/day (wife and children) Type of Home: House Home Access: Stairs to enter Entrance Stairs-Rails: None Entrance Stairs-Number of Steps: 1 Home Layout: Two level;Able to live on main level with bedroom/bathroom Home Equipment: Dan Humphreys - 2 wheels Additional Comments: Good support and preparation at home    Prior Function Level of Independence: Independent         Comments: works full time     Higher education careers adviser        Extremity/Trunk Assessment   Upper Extremity Assessment: Overall WFL for tasks assessed           Lower Extremity Assessment: RLE deficits/detail RLE Deficits / Details: Gross MMT at least 3/5 in RLE       Communication   Communication: No difficulties  Cognition Arousal/Alertness: Awake/alert Behavior During Therapy: WFL for tasks assessed/performed Overall Cognitive Status: Within Functional  Limits for tasks assessed                      General Comments      Exercises Total Joint Exercises Goniometric ROM: R knee AAROM: 5 - 75 degrees  Other Exercises Other Exercises: Pt performed bilateral LE therex x 10 reps at supervision for proper techniques. Exercises performed: ankle pumps, quad sets, glute sets, SLR, hip abd/add      Assessment/Plan    PT Assessment Patient needs continued PT services  PT  Diagnosis Difficulty walking;Abnormality of gait;Acute pain   PT Problem List Decreased strength;Decreased range of motion;Decreased mobility;Decreased activity tolerance;Decreased knowledge of use of DME;Pain  PT Treatment Interventions DME instruction;Gait training;Stair training;Functional mobility training;Therapeutic activities;Therapeutic exercise;Balance training;Neuromuscular re-education;Patient/family education;Manual techniques;Wheelchair mobility training;Modalities   PT Goals (Current goals can be found in the Care Plan section) Acute Rehab PT Goals Patient Stated Goal: To go home PT Goal Formulation: With patient Time For Goal Achievement: 07/27/15 Potential to Achieve Goals: Good    Frequency BID   Barriers to discharge        Co-evaluation               End of Session Equipment Utilized During Treatment: Gait belt Activity Tolerance: Patient tolerated treatment well Patient left: in chair;with chair alarm set;with SCD's reapplied;with call bell/phone within reach Nurse Communication: Mobility status         Time: 2671-2458 PT Time Calculation (min) (ACUTE ONLY): 27 min   Charges:         PT G CodesBenna Dunks 08/06/2015, 1:00 PM  Benna Dunks, SPT. [phone]

## 2015-07-13 NOTE — Progress Notes (Signed)
Pt. Alert and oriented. VSS. Pain controlled with PO pain meds. Neurochecks WDL. Foley patent with good output and will be removed this morning. Tolerating PO's. Resting quietly with wife at the bedside.

## 2015-07-13 NOTE — Evaluation (Signed)
Physical Therapy Evaluation Patient Details Name: Johnathan Salas MRN: [id number] DOB: 1956/06/20 Today's Date: 07/13/2015   History of Present Illness  This patient is a 59 year old male who came to [facility] for a R TKR.  Clinical Impression  Pt presents with hx of HTN, GERD, and arthritis. Examination reveals that performs all mobility at min assist. Pt has good upper body strength and LLE strength to assist himself with mobility acts. He is very motivated to participate in therapy and has a good support system in his wife. Pt has strength, ROM, and gait deficits that will benefit from skilled PT in order for him to return home safely. Pt able to perform 10 AROM SLRs on RLE, therefore KI not donned for mobility.     Follow Up Recommendations Home health PT;Supervision for mobility/OOB    Equipment Recommendations  None recommended by PT    Recommendations for Other Services       Precautions / Restrictions Precautions Precautions: Fall Restrictions Weight Bearing Restrictions: Yes Other Position/Activity Restrictions: WBAT      Mobility  Bed Mobility Overal bed mobility: Needs Assistance Bed Mobility: Supine to Sit     Supine to sit: Min assist     General bed mobility comments: Requires assist for LE to get into sitting. Good upper body strength to help himself up.  Transfers Overall transfer level: Needs assistance   Transfers: Sit to/from Stand Sit to Stand: Min assist         General transfer comment: Pt needs assist to help get into standing. Needs cueing to prop leg out as well for hand placement   Ambulation/Gait Ambulation/Gait assistance: Min assist Ambulation Distance (Feet): 10 Feet Assistive device: Rolling walker (2 wheeled) Gait Pattern/deviations: Step-to pattern;Decreased step length - right;Decreased step length - left;Decreased stride length;Shuffle Gait velocity: decreased    General Gait Details: Pt demonstrates step-to pattern and cues for  sequencing of RW with gait pattern. No buckling noted during ambulation.   Stairs            Wheelchair Mobility    Modified Rankin (Stroke Patients Only)       Balance Overall balance assessment: No apparent balance deficits (not formally assessed)                                           Pertinent Vitals/Pain Pain Assessment: 0-10 Pain Score: 4  Pain Location: R knee Pain Intervention(s): Limited activity within patient's tolerance;Monitored during session;Premedicated before session;Ice applied    Home Living Family/patient expects to be discharged to:: Private residence Living Arrangements: Spouse/significant other Available Help at Discharge: Family;Available 24 hours/day (wife and children) Type of Home: House Home Access: Stairs to enter Entrance Stairs-Rails: None Entrance Stairs-Number of Steps: 1 Home Layout: Two level;Able to live on main level with bedroom/bathroom Home Equipment: Dan Humphreys - 2 wheels Additional Comments: Good support and preparation at home    Prior Function Level of Independence: Independent         Comments: works full time     Higher education careers adviser        Extremity/Trunk Assessment   Upper Extremity Assessment: Overall WFL for tasks assessed           Lower Extremity Assessment: RLE deficits/detail RLE Deficits / Details: Gross MMT at least 3/5 in RLE       Communication   Communication: No difficulties  Cognition Arousal/Alertness: Awake/alert Behavior During Therapy: WFL for tasks assessed/performed Overall Cognitive Status: Within Functional Limits for tasks assessed                      General Comments      Exercises Total Joint Exercises Goniometric ROM: R knee AAROM: 5 - 75 degrees  Other Exercises Other Exercises: Pt performed bilateral LE therex x 10 reps at supervision for proper techniques. Exercises performed: ankle pumps, quad sets, glute sets, SLR, hip abd/add       Assessment/Plan    PT Assessment Patient needs continued PT services  PT Diagnosis Difficulty walking;Abnormality of gait;Acute pain   PT Problem List Decreased strength;Decreased range of motion;Decreased mobility;Decreased activity tolerance;Decreased knowledge of use of DME;Pain  PT Treatment Interventions DME instruction;Gait training;Stair training;Functional mobility training;Therapeutic activities;Therapeutic exercise;Balance training;Neuromuscular re-education;Patient/family education;Manual techniques;Wheelchair mobility training;Modalities   PT Goals (Current goals can be found in the Care Plan section) Acute Rehab PT Goals Patient Stated Goal: To go home PT Goal Formulation: With patient Time For Goal Achievement: 07/27/15 Potential to Achieve Goals: Good    Frequency BID   Barriers to discharge        Co-evaluation               End of Session Equipment Utilized During Treatment: Gait belt Activity Tolerance: Patient tolerated treatment well Patient left: in chair;with chair alarm set;with SCD's reapplied;with call bell/phone within reach Nurse Communication: Mobility status         Time: 9604-5409 PT Time Calculation (min) (ACUTE ONLY): 27 min   Charges:   PT Evaluation $Initial PT Evaluation Tier I: 1 Procedure PT Treatments $Therapeutic Exercise: 8-22 mins   PT G CodesBenna Dunks 07/23/15, 1:31 PM  Benna Dunks, SPT. [phone]

## 2015-07-13 NOTE — Progress Notes (Signed)
Referred to CSW for ?SNF. Chart reviewed and patient discussed in unit rounds with [facility] and Care Coordinator who indicate patient plans to d/c home with [facility] and DME. CSW to sign off- please contact us if SW needs arise. Reece Levy, MSW, Theresia Majors [phone]

## 2015-07-13 NOTE — Progress Notes (Signed)
Patient complains of pain this shift that is being managed with repositioning to chair instead of bed and po pain medication.  Patients plans to go home with HHPT at time of discharge.  Needs to have bowel movement.

## 2015-07-13 NOTE — Progress Notes (Signed)
Spoke with Dr. Allena Katz about telemetry order for patient. Dr. Allena Katz reviewed patient's rhythm at the desk and gave order to discontinue telemetry order.

## 2015-07-13 NOTE — Care Management Note (Addendum)
Case Management Note  Patient Details  Name: Johnathan Salas MRN: [id number] Date of Birth: 02-20-1956  Subjective/Objective:                  Met with patient and his wife to discuss discharge planning. His wife had accepted what turned out to be an outpatient physical therapy group [facility] [phone]). The adjoined home health PT program "[facility]" would not accept patient insurance. [facility] will take patient's insurance but cannot see patient until Monday [phone]. Fax [phone]. Wife does not want to use [facility] and will get back to this RNCM. He uses [facility] [phone] for Rx. He has a rolling walker.   Action/Plan: Lovenox 32m #14 called in to [facility]. RNCM will continue to follow- waiting on patient's wife to pick a home health agency.    Expected Discharge Date:                  Expected Discharge Plan:     In-House Referral:     Discharge planning Services  CM Consult  Post Acute Care Choice:  Home Health Choice offered to:  Patient, Spouse  DME Arranged:  N/A DME Agency:     HH Arranged:  PT HH Agency:     Status of Service:  In process, will continue to follow  Medicare Important Message Given:    Date Medicare IM Given:    Medicare IM give by:    Date Additional Medicare IM Given:    Additional Medicare Important Message give by:     [place], dates discussed:    Additional Comments: Wife contacted this RNCM selecting [facility] home health. [facility]  Lovenox $15. AMarshell Garfinkel RN 07/13/2015, 10:29 AM

## 2015-07-13 NOTE — Progress Notes (Signed)
Foley d/c'd at 0600 

## 2015-07-13 NOTE — Progress Notes (Signed)
   Subjective: 1 Day Post-Op Procedure(s) (LRB): TOTAL KNEE ARTHROPLASTY (Right) Patient reports pain as mild.   Patient is well, and has had no acute complaints or problems We will start therapy today.  Plan is to go Home after hospital stay. no nausea and no vomiting Patient denies any chest pains or shortness of breath. No episodes of additional diaphoresis, weakness or lethargic. Patient rested well on the night.  Denies any palpitation. Objective: Vital signs in last 24 hours: Temp:  [97 F (36.1 C)-99.7 F (37.6 C)] 99.7 F (37.6 C) (09/07 0430) Pulse Rate:  [40-88] 84 (09/07 0430) Resp:  [9-18] 18 (09/07 0430) BP: (89-127)/(53-75) 121/63 mmHg (09/07 0430) SpO2:  [96 %-100 %] 96 % (09/07 0430) FiO2 (%):  [21 %] 21 % (09/06 1224) Patient has original dressing in place Heels are non tender and elevated off the bed using bone foam to also help with rotation Intake/Output from previous day: 09/06 0701 - 09/07 0700 In: 4335 [P.O.:360; I.V.:3975] Out: 3525 [Urine:3425; Blood:100] Intake/Output this shift:     Recent Labs  07/12/15 1318 07/13/15 0612  HGB 13.3 11.6*    Recent Labs  07/12/15 1318 07/13/15 0612  WBC 11.3* 9.9  RBC 4.57 4.12*  HCT 39.4* 35.2*  PLT 219 205    Recent Labs  07/12/15 1318 07/13/15 0612  NA  --  137  K  --  3.8  CL  --  107  CO2  --  22  BUN  --  11  CREATININE 0.99 1.09  GLUCOSE  --  133*  CALCIUM  --  8.0*   No results for input(s): LABPT, INR in the last 72 hours.  EXAM General - Patient is Alert, Appropriate and Oriented Extremity - Neurologically intact Neurovascular intact Sensation intact distally Intact pulses distally Dorsiflexion/Plantar flexion intact Dressing - dressing C/D/I Motor Function - intact, moving foot and toes well on exam.   Past Medical History  Diagnosis Date  . Hypertension   . GERD (gastroesophageal reflux disease)   . Arthritis     Assessment/Plan: 1 Day Post-Op Procedure(s)  (LRB): TOTAL KNEE ARTHROPLASTY (Right) Active Problems:   Primary osteoarthritis of knee   Hypotension  Estimated body mass index is 31.25 kg/(m^2) as calculated from the following:   Height as of this encounter:  (1.905 m).   Weight as of this encounter: 113.399 kg (250 lb). Advance diet Up with therapy D/C IV fluids Discharge home with home health on Friday  Labs: Were reviewed DVT Prophylaxis - Lovenox, Foot Pumps and TED hose Weight-Bearing as tolerated to right leg Begin working on a bowel movement Okay to begin physical therapy without restrictions D/C O2 and Pulse OX and try on Room [facility] 07/13/2015, 7:07 AM

## 2015-07-14 LAB — URINALYSIS COMPLETE WITH MICROSCOPIC (ARMC ONLY)
BACTERIA UA: NONE SEEN
Bilirubin Urine: NEGATIVE
GLUCOSE, UA: NEGATIVE mg/dL
Ketones, ur: NEGATIVE mg/dL
Leukocytes, UA: NEGATIVE
Nitrite: NEGATIVE
PROTEIN: NEGATIVE mg/dL
Specific Gravity, Urine: 1.005 (ref 1.005–1.030)
Squamous Epithelial / LPF: NONE SEEN
pH: 6 (ref 5.0–8.0)

## 2015-07-14 LAB — SURGICAL PATHOLOGY

## 2015-07-14 MED ORDER — TRAMADOL HCL 50 MG PO TABS
50.0000 mg | ORAL_TABLET | Freq: Four times a day (QID) | ORAL | Status: DC | PRN
  Administered 2015-07-14: 50 mg via ORAL
  Filled 2015-07-14: qty 1

## 2015-07-14 MED ORDER — OXYCODONE HCL 5 MG PO TABS
5.0000 mg | ORAL_TABLET | ORAL | Status: DC | PRN
Start: 2015-07-14 — End: 2015-09-21

## 2015-07-14 MED ORDER — SODIUM CHLORIDE 0.9 % IV BOLUS (SEPSIS)
500.0000 mL | Freq: Once | INTRAVENOUS | Status: AC
  Administered 2015-07-14: 500 mL via INTRAVENOUS

## 2015-07-14 MED ORDER — ENOXAPARIN SODIUM 40 MG/0.4ML ~~LOC~~ SOLN: 40.0000 mg | Freq: Two times a day (BID) | SUBCUTANEOUS | Status: DC

## 2015-07-14 NOTE — Progress Notes (Signed)
Physical Therapy Treatment Patient Details Name: Johnathan Salas MRN: [id number] DOB: 08-19-1956 Today's Date: 07/14/2015    History of Present Illness This patient is a 59 year old male who came to [facility] for a R TKR.    PT Comments    Pt doing much better this afternoon with blood pressure and not feeling light headed. He is able to ambulate greater distances and continues to show good functional strength with bed mobility and transfers. He still needs cues to slow down at times, but is very enjoyable to work with and motivated to progress in his therapy. Due to his acute ROM, strength, and gait deficits, he will continue to benefit from skilled PT in order to return home safely.   Follow Up Recommendations  Home health PT;Supervision for mobility/OOB     Equipment Recommendations  None recommended by PT    Recommendations for Other Services       Precautions / Restrictions Precautions Precautions: Fall Restrictions Weight Bearing Restrictions: Yes Other Position/Activity Restrictions: WBAT    Mobility  Bed Mobility               General bed mobility comments: Not assessed this afternoon.   Transfers Overall transfer level: Needs assistance Equipment used: Rolling walker (2 wheeled) Transfers: Sit to/from Stand Sit to Stand: Min assist         General transfer comment: Pt needs assist to help get into standing. Needs cueing to prop leg out as well for hand placement. Needs cues to tuck LLE underneath him for stronger push-off with standing  Ambulation/Gait Ambulation/Gait assistance: Min guard Ambulation Distance (Feet): 80 Feet Assistive device: Rolling walker (2 wheeled) Gait Pattern/deviations: Step-to pattern;Step-through pattern;Decreased step length - right;Decreased step length - left;Decreased stride length;Antalgic Gait velocity: decreased  Gait velocity interpretation: Below normal speed for age/gender General Gait Details: Pt demonstrates step-to  pattern and cues for sequencing of RW with gait pattern. No buckling noted during ambulation.  (This afternoon can perform step-through with cueing)   Stairs            Wheelchair Mobility    Modified Rankin (Stroke Patients Only)       Balance Overall balance assessment: No apparent balance deficits (not formally assessed)                                  Cognition Arousal/Alertness: Awake/alert Behavior During Therapy: WFL for tasks assessed/performed Overall Cognitive Status: Within Functional Limits for tasks assessed                      Exercises Total Joint Exercises Goniometric ROM: R knee AAROM: 3 - 78 degrees Other Exercises Other Exercises: Pt performed bilateral LE therex x 12 reps at supervision for proper techniques. Exercises performed: ankle pumps, quad sets, glute sets, SLR, hip abd/add, heel slides    General Comments        Pertinent Vitals/Pain Pain Assessment: 0-10 Pain Score: 8  Pain Location: L knee: pt has chronic pain in L knee that is bothering him  Pain Intervention(s): Limited activity within patient's tolerance;Monitored during session;Premedicated before session;Ice applied (Donned KI on L knee for mobility )    Home Living                      Prior Function            PT Goals (current goals  can now be found in the care plan section) Acute Rehab PT Goals Patient Stated Goal: To get up and walk PT Goal Formulation: With patient Time For Goal Achievement: 07/27/15 Potential to Achieve Goals: Good Progress towards PT goals: Progressing toward goals    Frequency  BID    PT Plan Current plan remains appropriate    Co-evaluation             End of Session Equipment Utilized During Treatment: Gait belt Activity Tolerance: Patient tolerated treatment well Patient left: in chair;with chair alarm set;with SCD's reapplied;with family/visitor present;with call bell/phone within reach      Time: 1400-1425 PT Time Calculation (min) (ACUTE ONLY): 25 min  Charges:                       G CodesBenna Dunks 2015/08/08, 4:30 PM  Benna Dunks, SPT. [phone]

## 2015-07-14 NOTE — Progress Notes (Signed)
Called to pt room by wife, pt is sweating, pale no SOB, No pain bp 123/62 lying and 119/73 sitting, hr 79 pt states he was unable to walk with physical therapy due to becoming lighted earlier , Dr Allena Katz notified orders received to stop bp med and give 500 cc NS bolus

## 2015-07-14 NOTE — Plan of Care (Signed)
Problem: Phase I Progression Outcomes Goal: Pain controlled with appropriate interventions Outcome: Adequate for Discharge Pain control with oral pain medication Goal: Hemodynamically stable Outcome: Adequate for Discharge No longer requiring iv fluids  Problem: Phase III Progression Outcomes Goal: Ambulates Outcome: Progressing Ambulating in and outside of room with assistance.

## 2015-07-14 NOTE — Progress Notes (Signed)
DENIES ANY FURTHER DIZZINESS OR DIAPHORESIS. STARTED ON ULTRAM. WILL MONITOR RESPONSE. ICE PAK TO LEFT KNEE FOR DISCOMFORT AND WEARING IMMOBILIZER FOR PAIN LLE. Marland Kitchen RLE WITH NEURO CHECKS WNL.

## 2015-07-14 NOTE — Discharge Instructions (Signed)

## 2015-07-14 NOTE — Progress Notes (Signed)
Physical Therapy Treatment Patient Details Name: Johnathan Salas MRN: [id number] DOB: Nov 25, 1955 Today's Date: 07/14/2015    History of Present Illness This patient is a 59 year old male who came to [facility] for a R TKR.    PT Comments    Pt limited in mobility this morning secondary to lightheadedness in standing (reference general comments section). Although pt was limited by lightheadedness he remains very motivated to participate in therapy despite this fact. Pt still has ROM, strength, and mobility deficits that will benefit from skilled PT in order for him to return home safely. Will attempt gait training again this afternoon pending pt BP status.   Follow Up Recommendations  Home health PT;Supervision for mobility/OOB     Equipment Recommendations  None recommended by PT    Recommendations for Other Services       Precautions / Restrictions Precautions Precautions: Fall Restrictions Weight Bearing Restrictions: Yes Other Position/Activity Restrictions: WBAT    Mobility  Bed Mobility Overal bed mobility: Needs Assistance Bed Mobility: Supine to Sit     Supine to sit: Supervision     General bed mobility comments: Pt able to perform bed mobility with relative independence and increased time. Supervision for safety.  Transfers Overall transfer level: Needs assistance Equipment used: Rolling walker (2 wheeled) Transfers: Sit to/from Stand Sit to Stand: Min assist         General transfer comment: Pt needs assist to help get into standing. Needs cueing to prop leg out as well for hand placement. Needs cues to tuck LLE underneath him for stronger push-off with standing  Ambulation/Gait Ambulation/Gait assistance:  (Not performed secondary to symptomatic hypotension )               Stairs            Wheelchair Mobility    Modified Rankin (Stroke Patients Only)       Balance Overall balance assessment: No apparent balance deficits (not formally  assessed)                                  Cognition Arousal/Alertness: Awake/alert Behavior During Therapy: WFL for tasks assessed/performed Overall Cognitive Status: Within Functional Limits for tasks assessed                      Exercises Total Joint Exercises Goniometric ROM: R knee AAROM, extension: lacking 3 degrees (Flexion to be assessed this afternoon ) Other Exercises Other Exercises: Pt performed bilateral LE therex x 12 reps at supervision for proper techniques. Exercises performed: ankle pumps, quad sets, glute sets, SLR, hip abd/add, heel slides    General Comments General comments (skin integrity, edema, etc.): Pt performed stand to use urinal, which after 1 minute of standing pt needed to be placed into supine secondary to lightheadedness. Pt provided cold towel and water, which seemed to help. BP in supine was 131/71. Nurse came in to check blood glucose (reference Brenda's note). Pt then wished to attempt stand again, where BP was taken in standing, which was 106/57. Pt again became symptomatic and was returned to supine where the session was ended. Following 5 minutes rest and resolve of symptoms, pt's BP was taken one last time, which was 126/68. Nurse notified.         Pertinent Vitals/Pain Pain Assessment: 0-10 Pain Score: 6  Pain Location: R knee (Pt also c/o pain in L knee) Pain Intervention(s):  Limited activity within patient's tolerance;Monitored during session;Premedicated before session;Ice applied    Home Living                      Prior Function            PT Goals (current goals can now be found in the care plan section) Acute Rehab PT Goals Patient Stated Goal: To get up and walk PT Goal Formulation: With patient Time For Goal Achievement: 07/27/15 Potential to Achieve Goals: Good Progress towards PT goals: Progressing toward goals    Frequency  BID    PT Plan Current plan remains appropriate     Co-evaluation             End of Session Equipment Utilized During Treatment: Gait belt Activity Tolerance: Patient tolerated treatment well Patient left: in bed;with bed alarm set;with call bell/phone within reach;with family/visitor present;with SCD's reapplied     Time: 1610-9604 PT Time Calculation (min) (ACUTE ONLY): 40 min  Charges:                       G CodesBenna Dunks 2015-07-18, 11:03 AM Benna Dunks, SPT. [phone]

## 2015-07-14 NOTE — Progress Notes (Signed)
  Subjective: 2 Days Post-Op Procedure(s) (LRB): TOTAL KNEE ARTHROPLASTY (Right) Patient reports pain as mild.   Patient seen in rounds with Dr. Rosita Kea. Patient is well, and has had no acute complaints or problems Plan is to go Home after hospital stay. Negative for chest pain and shortness of breath Fever: no Gastrointestinal: Negative for nausea and vomiting  Objective: Vital signs in last 24 hours: Temp:  [98.7 F (37.1 C)-100 F (37.8 C)] 100 F (37.8 C) (09/08 0348) Pulse Rate:  [70-101] 101 (09/08 0348) Resp:  [16-18] 18 (09/08 0348) BP: (124-140)/(63-77) 133/68 mmHg (09/08 0348) SpO2:  [96 %-99 %] 96 % (09/08 0348)  Intake/Output from previous day:  Intake/Output Summary (Last 24 hours) at 07/14/15 0608 Last data filed at 07/13/15 2019  Gross per 24 hour  Intake 1291.67 ml  Output   2250 ml  Net -958.33 ml    Intake/Output this shift: Total I/O In: 240 [P.O.:240] Out: -   Labs:  Recent Labs  07/12/15 1318 07/13/15 0612  HGB 13.3 11.6*    Recent Labs  07/12/15 1318 07/13/15 0612  WBC 11.3* 9.9  RBC 4.57 4.12*  HCT 39.4* 35.2*  PLT 219 205    Recent Labs  07/12/15 1318 07/13/15 0612  NA  --  137  K  --  3.8  CL  --  107  CO2  --  22  BUN  --  11  CREATININE 0.99 1.09  GLUCOSE  --  133*  CALCIUM  --  8.0*   No results for input(s): LABPT, INR in the last 72 hours.   EXAM General - Patient is Alert and Oriented Extremity - Neurovascular intact Intact pulses distally Dorsiflexion/Plantar flexion intact No cellulitis present Compartment soft Dressing/Incision - clean, dry, no drainage, a new honeycomb dressing was applied. Motor Function - intact, moving foot and toes well on exam. The patient ambulates 45 feet with physical therapy.  Past Medical History  Diagnosis Date  . Hypertension   . GERD (gastroesophageal reflux disease)   . Arthritis     Assessment/Plan: 2 Days Post-Op Procedure(s) (LRB): TOTAL KNEE ARTHROPLASTY  (Right) Active Problems:   Primary osteoarthritis of knee   Hypotension  Estimated body mass index is 31.25 kg/(m^2) as calculated from the following:   Height as of this encounter:  (1.905 m).   Weight as of this encounter: 113.399 kg (250 lb). Plan for discharge tomorrow  DVT Prophylaxis - Lovenox, Foot Pumps and TED hose Weight-Bearing as tolerated to right leg  Dedra Skeens, PA-C Orthopaedic Surgery 07/14/2015, 6:08 AM

## 2015-07-14 NOTE — Progress Notes (Signed)
Pt became lighted headed when attempting to get out of bed for physical therapy pt was standing at time physical therapist assissted pt back to bed, vital signs checked bp 131/71 heartrate 94 , pt awake , finger stick 152 , in supine position  Pt assisted to standing bp check 106/70

## 2015-07-14 NOTE — Progress Notes (Addendum)
DR PATEL ON ROUNDS. MD REPORTS D/C OXYCODONE, START ULTRAM 50-100 Q6HR PRN,U/A . PT HAS COMPLETED BOLUS. FEELS BETTER . NO SWEATING.

## 2015-07-15 MED ORDER — TRAMADOL HCL 50 MG PO TABS: 50.0000 mg | ORAL_TABLET | Freq: Four times a day (QID) | ORAL | Status: DC | PRN

## 2015-07-15 NOTE — Discharge Summary (Signed)
Physician Discharge Summary  Subjective: 3 Days Post-Op Procedure(s) (LRB): TOTAL KNEE ARTHROPLASTY (Right) Patient reports pain as mild.   Patient seen in rounds with Dr. Rosita Kea. Patient is well, and has had no acute complaints or problems Patient is ready to go home with home health physical therapy.  Physician Discharge Summary  Patient ID: Johnathan Salas MRN: [id number] DOB/AGE: 1956-05-13 59 y.o.  Admit date: 07/12/2015 Discharge date: 07/15/2015  Admission Diagnoses:  Discharge Diagnoses:  Active Problems:   Primary osteoarthritis of knee   Hypotension   Discharged Condition: fair  Hospital Course: The patient is postop day 3 from a right total knee replacement. The patient is doing well since surgery. He has to physical therapy and is ambulating about 80 feet so far. The patient did have some dizziness initially when he went to stand up quickly was able to stabilize this. The patient is ready to go home with home health physical therapy. Were no other complications in the hospital.  Treatments: surgery:  PROCEDURE: Procedure(s): TOTAL KNEE ARTHROPLASTY (Right)  SURGEON: Leitha Schuller, MD  ASSISTANTS: None  ANESTHESIA: spinal  EBL: Total I/O In: 1500 [I.V.:1500] Out: 400 [Urine:300; Blood:100]  BLOOD ADMINISTERED:none  DRAINS: none   LOCAL MEDICATIONS USED: MARCAINE and OTHER Toradol morphine and Exparel  SPECIMEN: Source of Specimen: Cut ends of bone right knee  DISPOSITION OF SPECIMEN: PATHOLOGY  COUNTS: YES  TOURNIQUET:  114 minutes at 300 mmHg  IMPLANTS: Medacta GMK sphere 6 femur 6 tibia with 10 mm insert and 3 patella all components cemented  Discharge Exam: Blood pressure 122/64, pulse 86, temperature 100.4 F (38 C), temperature source Oral, resp. rate 18, height 6\' 3"  (1.905 m), weight 113.399 kg (250 lb), SpO2 95 %.   Disposition: Final discharge disposition not confirmed     Medication List    TAKE these medications        amLODipine 10 MG tablet  Commonly known as:  NORVASC  Take 10 mg by mouth every morning.     celecoxib 200 MG capsule  Commonly known as:  CELEBREX  Take 200 mg by mouth daily.     enoxaparin 40 MG/0.4ML injection  Commonly known as:  LOVENOX  Inject 0.4 mLs (40 mg total) into the skin every 12 (twelve) hours.     oxyCODONE 5 MG immediate release tablet  Commonly known as:  Oxy IR/ROXICODONE  Take 1-2 tablets (5-10 mg total) by mouth every 3 (three) hours as needed for breakthrough pain.           Follow-up Information    Follow up with MENZ,MICHAEL, MD.   Specialty:  Orthopedic Surgery   Why:  for staple removal   Contact information:   [address] [place] [address] [phone]       Signed: Lenard Forth, Derricka Mertz 07/15/2015, 6:01 AM   Objective: Vital signs in last 24 hours: Temp:  [98.4 F (36.9 C)-100.4 F (38 C)] 100.4 F (38 C) (09/09 0358) Pulse Rate:  [86-101] 86 (09/09 0358) Resp:  [16-18] 18 (09/09 0358) BP: (119-133)/(62-76) 122/64 mmHg (09/09 0358) SpO2:  [95 %-97 %] 95 % (09/09 0358)  Intake/Output from previous day:  Intake/Output Summary (Last 24 hours) at 07/15/15 0601 Last data filed at 07/14/15 1011  Gross per 24 hour  Intake    597 ml  Output      0 ml  Net    597 ml    Intake/Output this shift:    Labs:  Recent Labs  07/12/15  1318 07/13/15 0612  HGB 13.3 11.6*    Recent Labs  07/12/15 1318 07/13/15 0612  WBC 11.3* 9.9  RBC 4.57 4.12*  HCT 39.4* 35.2*  PLT 219 205    Recent Labs  07/12/15 1318 07/13/15 0612  NA  --  137  K  --  3.8  CL  --  107  CO2  --  22  BUN  --  11  CREATININE 0.99 1.09  GLUCOSE  --  133*  CALCIUM  --  8.0*   No results for input(s): LABPT, INR in the last 72 hours.  EXAM: General - Patient is Alert and Oriented Extremity - Neurovascular intact Dorsiflexion/Plantar flexion intact No cellulitis present Compartment soft Incision - clean, dry, no drainage Motor Function -  the  patient is able to dorsiflex and plantarflex his feet with a straight leg raise being intact. The patient is ambulating 80 feet with physical therapy.  Assessment/Plan: 3 Days Post-Op Procedure(s) (LRB): TOTAL KNEE ARTHROPLASTY (Right) Procedure(s) (LRB): TOTAL KNEE ARTHROPLASTY (Right) Past Medical History  Diagnosis Date  . Hypertension   . GERD (gastroesophageal reflux disease)   . Arthritis    Active Problems:   Primary osteoarthritis of knee   Hypotension  Estimated body mass index is 31.25 kg/(m^2) as calculated from the following:   Height as of this encounter:  (1.905 m).   Weight as of this encounter: 113.399 kg (250 lb). Discharge home with home health Diet - Regular diet Follow up - in 2 weeks Activity - WBAT Disposition - Home Condition Upon Discharge - Stable DVT Prophylaxis - Lovenox and TED hose  Dedra Skeens, PA-C Orthopaedic Surgery 07/15/2015, 6:01 AM

## 2015-07-15 NOTE — Progress Notes (Signed)
Patient alert and oriented. Patient discharge orders reviewed and prescriptions given. Patient transported to family vehicle via nursing.

## 2015-07-15 NOTE — Progress Notes (Signed)
  Subjective: 3 Days Post-Op Procedure(s) (LRB): TOTAL KNEE ARTHROPLASTY (Right) Patient reports pain as mild.   Patient seen in rounds with Dr. Rosita Kea. Patient is well, and has had no acute complaints or problems Plan is to go Home after hospital stay. Negative for chest pain and shortness of breath Fever: no Gastrointestinal: Negative for nausea and vomiting. The patient had a bowel movement yesterday.  Objective: Vital signs in last 24 hours: Temp:  [98.4 F (36.9 C)-100.4 F (38 C)] 100.4 F (38 C) (09/09 0358) Pulse Rate:  [86-101] 86 (09/09 0358) Resp:  [16-18] 18 (09/09 0358) BP: (119-133)/(62-76) 122/64 mmHg (09/09 0358) SpO2:  [95 %-97 %] 95 % (09/09 0358)  Intake/Output from previous day:  Intake/Output Summary (Last 24 hours) at 07/15/15 0600 Last data filed at 07/14/15 1011  Gross per 24 hour  Intake    597 ml  Output      0 ml  Net    597 ml    Intake/Output this shift:    Labs:  Recent Labs  07/12/15 1318 07/13/15 0612  HGB 13.3 11.6*    Recent Labs  07/12/15 1318 07/13/15 0612  WBC 11.3* 9.9  RBC 4.57 4.12*  HCT 39.4* 35.2*  PLT 219 205    Recent Labs  07/12/15 1318 07/13/15 0612  NA  --  137  K  --  3.8  CL  --  107  CO2  --  22  BUN  --  11  CREATININE 0.99 1.09  GLUCOSE  --  133*  CALCIUM  --  8.0*   No results for input(s): LABPT, INR in the last 72 hours.   EXAM General - Patient is Alert and Oriented Extremity - Neurovascular intact Dorsiflexion/Plantar flexion intact No cellulitis present Compartment soft Dressing/Incision - clean, dry, no drainage Motor Function - intact, moving foot and toes well on exam. The patient ambulated 80 feet with physical therapy, but had mild dizziness when he first got up.  Past Medical History  Diagnosis Date  . Hypertension   . GERD (gastroesophageal reflux disease)   . Arthritis     Assessment/Plan: 3 Days Post-Op Procedure(s) (LRB): TOTAL KNEE ARTHROPLASTY (Right) Active  Problems:   Primary osteoarthritis of knee   Hypotension  Estimated body mass index is 31.25 kg/(m^2) as calculated from the following:   Height as of this encounter:  (1.905 m).   Weight as of this encounter: 113.399 kg (250 lb). Discharge home with home health  DVT Prophylaxis - Lovenox, Foot Pumps and TED hose Weight-Bearing as tolerated to right leg  Dedra Skeens, PA-C Orthopaedic Surgery 07/15/2015, 6:00 AM

## 2015-07-15 NOTE — Plan of Care (Signed)
Problem: Phase III Progression Outcomes Goal: Ambulates Outcome: Progressing As tolerated - per orders

## 2015-07-15 NOTE — Progress Notes (Signed)
Physical Therapy Treatment Patient Details Name: Johnathan Salas MRN: [id number] DOB: 03-19-1956 Today's Date: 07/15/2015    History of Present Illness This patient is a 59 year old male who came to [facility] for a R TKR.    PT Comments    Pt made good progress towards goals this morning with considerably greater ambulation distance and practice of his one step at home. Pt understand safety measures, and the fact that he initially needs assistance from his two sons for transfers in order to be safe. Pt still has ROM, strength, and mobility deficits that will benefit from skilled PT in order to return to optimal PLOF.   Follow Up Recommendations  Home health PT;Supervision for mobility/OOB     Equipment Recommendations  None recommended by PT    Recommendations for Other Services       Precautions / Restrictions Precautions Precautions: Fall Restrictions Weight Bearing Restrictions: Yes Other Position/Activity Restrictions: WBAT    Mobility  Bed Mobility Overal bed mobility: Needs Assistance Bed Mobility: Supine to Sit     Supine to sit: Supervision     General bed mobility comments: Pt able to manage his LE in bed with increased time. Good use of hands to scoot.   Transfers Overall transfer level: Needs assistance Equipment used: Rolling walker (2 wheeled) Transfers: Sit to/from Stand Sit to Stand: Min assist         General transfer comment: Pt transferred with and without the knee imobilizer on his left leg (non-operative). Pt cannot effectively perform transfer with KI on due to lack of flexion necessary in his strong leg. Without it on he is still limited by pain and ROM but can perform the transfer by good use of his hands. Pt was instructed that at home he must have his sons present with transfers in the event that he needs assistance. He agreed with this. PT recommends a brace for his L knee that allows for ample flexion yet provides some support.    Ambulation/Gait Ambulation/Gait assistance: Min guard Ambulation Distance (Feet): 220 Feet Assistive device: Rolling walker (2 wheeled) Gait Pattern/deviations: Step-through pattern;Decreased step length - right;Decreased step length - left;Antalgic Gait velocity: decreased  Gait velocity interpretation: Below normal speed for age/gender General Gait Details: Pt needs cues to shorten his step on the left in order to facilitate symmetrical reciprocal gait. Pt requires multiple standing breaks. Says he's more fatigued today due to fever   Stairs Stairs: Yes Stairs assistance: Min guard Stair Management: No rails Number of Stairs: 1 General stair comments: Pt performed 1 step with RW. Good strength and has necessary ROM to navigate step safely.   Wheelchair Mobility    Modified Rankin (Stroke Patients Only)       Balance Overall balance assessment: No apparent balance deficits (not formally assessed)                                  Cognition Arousal/Alertness: Awake/alert Behavior During Therapy: WFL for tasks assessed/performed Overall Cognitive Status: Within Functional Limits for tasks assessed                      Exercises Total Joint Exercises Goniometric ROM: R knee AAROM: 2 - 86 Other Exercises Other Exercises: Pt performed bilateral LE therex x 15 reps at supervision for proper techniques. Exercises performed: ankle pumps, quad sets, glute sets, SLR, hip abd/add, heel slides    General  Comments        Pertinent Vitals/Pain Pain Assessment: 0-10 Pain Score: 7  Pain Location: L knee Pain Intervention(s): Premedicated before session;Monitored during session;Limited activity within patient's tolerance;Ice applied    Home Living                      Prior Function            PT Goals (current goals can now be found in the care plan section) Acute Rehab PT Goals Patient Stated Goal: To walk around the nursing station PT  Goal Formulation: With patient Time For Goal Achievement: 07/27/15 Potential to Achieve Goals: Good Progress towards PT goals: Progressing toward goals    Frequency  BID    PT Plan Current plan remains appropriate    Co-evaluation             End of Session Equipment Utilized During Treatment: Gait belt Activity Tolerance: Patient tolerated treatment well Patient left: in bed;with bed alarm set;with call bell/phone within reach     Time: 0829-0900 PT Time Calculation (min) (ACUTE ONLY): 31 min  Charges:                       G CodesBenna Dunks July 29, 2015, 12:44 PM Benna Dunks, SPT. [phone]

## 2015-07-15 NOTE — Progress Notes (Signed)
[facility] - [place] at [facility]   PATIENT NAME: Johnathan Salas    MR#:  [id number]  DATE OF BIRTH:  07/11/1956  SUBJECTIVE:  Feels better today. Tolerated PT later y'day  REVIEW OF SYSTEMS:   Review of Systems  Constitutional: Negative for fever, chills and weight loss.  HENT: Negative for ear discharge, ear pain and nosebleeds.   Eyes: Negative for blurred vision, pain and discharge.  Respiratory: Negative for sputum production, shortness of breath, wheezing and stridor.   Cardiovascular: Negative for chest pain, palpitations, orthopnea and PND.  Gastrointestinal: Negative for nausea, vomiting, abdominal pain and diarrhea.  Genitourinary: Negative for urgency and frequency.  Musculoskeletal: Negative for back pain and joint pain.  Neurological: Negative for sensory change, speech change, focal weakness and weakness.  Psychiatric/Behavioral: Negative for depression. The patient is not nervous/anxious.   All other systems reviewed and are negative.  Tolerating Diet:yes Tolerating PT: HHPT  DRUG ALLERGIES:  No Known Allergies  VITALS:  Blood pressure 124/70, pulse 93, temperature 98.4 F (36.9 C), temperature source Oral, resp. rate 18, height  (1.905 m), weight 113.399 kg (250 lb), SpO2 97 %.  PHYSICAL EXAMINATION:   Physical Exam  GENERAL:  59 y.o.-year-old patient lying in the bed with no acute distress.  EYES: Pupils equal, round, reactive to light and accommodation. No scleral icterus. Extraocular muscles intact.  HEENT: Head atraumatic, normocephalic. Oropharynx and nasopharynx clear.  NECK:  Supple, no jugular venous distention. No thyroid enlargement, no tenderness.  LUNGS: Normal breath sounds bilaterally, no wheezing, rales, rhonchi. No use of accessory muscles of respiration.  CARDIOVASCULAR: S1, S2 normal. No murmurs, rubs, or gallops.  ABDOMEN: Soft, nontender, nondistended. Bowel sounds present. No organomegaly or mass.   EXTREMITIES: No cyanosis, clubbing or edema b/l.   Right knee brace+ NEUROLOGIC: Cranial nerves II through XII are intact. No focal Motor or sensory deficits b/l.   PSYCHIATRIC: The patient is alert and oriented x 3.  SKIN: No obvious rash, lesion, or ulcer.    LABORATORY PANEL:   CBC  Recent Labs Lab 07/13/15 0612  WBC 9.9  HGB 11.6*  HCT 35.2*  PLT 205    Chemistries   Recent Labs Lab 07/13/15 0612  NA 137  K 3.8  CL 107  CO2 22  GLUCOSE 133*  BUN 11  CREATININE 1.09  CALCIUM 8.0*    Cardiac Enzymes No results for input(s): TROPONINI in the last 168 hours.  RADIOLOGY:  No results found.   ASSESSMENT AND PLAN:   59 year old Mr. Brusca with past medical history of hypertension osteoarthritis is postop right knee replacement. Internal medicine was consulted for  1. Acute hypotension, lethargy, weakness and diaphoresis suspected due to low blood pressure in the setting of pain medication -pt is  postop right total knee replacement POD#3.Patient had elective surgery by Dr. Rosita Kea.  -received IV bolus yday. Hold bp meds for 1 week. Pt to keep log of BP and if starts going up then resume it Eating well. Tolerating PT.  EKG shows sinus bradycardia and no acute ST changes.   2. Right knee elective replacement POD #3 by Dr. Rosita Kea Changed po oxycodone to ultram as needed Physical therapy recommends HHPT  Ok to d/c from medical standpoint.  Case discussed with Care Management/Social Worker. Management plans discussed with the patient, family and they are in agreement.  CODE STATUS: full  DVT Prophylaxis: lovenox  TOTAL TIME TAKING CARE OF THIS PATIENT: 30 minutes.  >50% time spent  on counselling and coordination of care  Sheniece Ruggles M.D on 07/15/2015 at 9:56 AM  Between 7am to 6pm - Pager - [phone]  After 6pm go to [email or web address] - password [email or web address] [facility]  [place] [facility]  Office  [phone]  CC: Primary care physician; Eldridge Abrahams, MD

## 2015-07-15 NOTE — Progress Notes (Signed)
[facility] Physicians - [place] at [facility]   PATIENT NAME: Johnathan Salas    MR#:  [id number]  DATE OF BIRTH:  1956-01-21  SUBJECTIVE:  Called in for feeling diaphoretic while trying to do PT, BP down to 106  REVIEW OF SYSTEMS:   Review of Systems  Constitutional: Negative for fever, chills and weight loss.  HENT: Negative for ear discharge, ear pain and nosebleeds.   Eyes: Negative for blurred vision, pain and discharge.  Respiratory: Negative for sputum production, shortness of breath, wheezing and stridor.   Cardiovascular: Negative for chest pain, palpitations, orthopnea and PND.  Gastrointestinal: Negative for nausea, vomiting, abdominal pain and diarrhea.  Genitourinary: Negative for urgency and frequency.  Musculoskeletal: Negative for back pain and joint pain.  Neurological: Negative for sensory change, speech change, focal weakness and weakness.  Psychiatric/Behavioral: Negative for depression. The patient is not nervous/anxious.   All other systems reviewed and are negative.  Tolerating Diet:yes Tolerating PT: HHPT  DRUG ALLERGIES:  No Known Allergies  VITALS:  Blood pressure 124/70, pulse 93, temperature 98.4 F (36.9 C), temperature source Oral, resp. rate 18, height 6\' 3"  (1.905 m), weight 113.399 kg (250 lb), SpO2 97 %.  PHYSICAL EXAMINATION:   Physical Exam  GENERAL:  59 y.o.-year-old patient lying in the bed with no acute distress.  EYES: Pupils equal, round, reactive to light and accommodation. No scleral icterus. Extraocular muscles intact.  HEENT: Head atraumatic, normocephalic. Oropharynx and nasopharynx clear.  NECK:  Supple, no jugular venous distention. No thyroid enlargement, no tenderness.  LUNGS: Normal breath sounds bilaterally, no wheezing, rales, rhonchi. No use of accessory muscles of respiration.  CARDIOVASCULAR: S1, S2 normal. No murmurs, rubs, or gallops.  ABDOMEN: Soft, nontender, nondistended. Bowel sounds present. No  organomegaly or mass.  EXTREMITIES: No cyanosis, clubbing or edema b/l.   Right knee brace+ NEUROLOGIC: Cranial nerves II through XII are intact. No focal Motor or sensory deficits b/l.   PSYCHIATRIC: The patient is alert and oriented x 3.  SKIN: No obvious rash, lesion, or ulcer.    LABORATORY PANEL:   CBC  Recent Labs Lab 07/13/15 0612  WBC 9.9  HGB 11.6*  HCT 35.2*  PLT 205    Chemistries   Recent Labs Lab 07/13/15 0612  NA 137  K 3.8  CL 107  CO2 22  GLUCOSE 133*  BUN 11  CREATININE 1.09  CALCIUM 8.0*    Cardiac Enzymes No results for input(s): TROPONINI in the last 168 hours.  RADIOLOGY:  No results found.   ASSESSMENT AND PLAN:   59 year old Mr. Veldhuizen with past medical history of hypertension osteoarthritis is postop right knee replacement. Internal medicine was consulted for  1. Acute hypotension, lethargy, weakness and diaphoresis suspected due to low blood pressure in the setting of pain medication -pt is  postop right total knee replacement POD#2.Patient had elective surgery by Dr. Rosita Kea.  -IV bolus now. Hold bp meds Eating well. Tolerating PT.  EKG shows sinus bradycardia and no acute ST changes.  Patient is alert oriented 3 no neuro deficit.   2. Right knee elective replacement POD #2 by Dr. Rosita Kea Changed po oxycodone to [facility] as needed Physical therapy recommends HHPT  Case discussed with Care Management/Social Worker. Management plans discussed with the patient, family and they are in agreement.  CODE STATUS: full  DVT Prophylaxis: lovenox  TOTAL TIME TAKING CARE OF THIS PATIENT: 30 minutes.  >50% time spent on counselling and coordination of care  Lenell Lama M.D  on 07/15/2015 at 9:54 AM  Between 7am to 6pm - Pager - [phone]  After 6pm go to [email or web address] - password [email or web address] [facility]  [place] [facility]  Office  [phone]  CC: Primary care physician; Eldridge Abrahams, MD

## 2015-07-15 NOTE — Care Management Note (Signed)
Case Management Note  Patient Details  Name: Roy Tokarz MRN: [id number] Date of Birth: Aug 26, 1956  Subjective/Objective:    Spoke with Bonita Quin at [employer], she received all the discharge orders yesterday.  Made her aware that patient is discharging today. They will make their first visit on Monday                               Action/Plan:   Expected Discharge Date:                  Expected Discharge Plan:  Home w Home Health Services  In-House Referral:     Discharge planning Services  CM Consult  Post Acute Care Choice:  Home Health Choice offered to:  Patient, Spouse  DME Arranged:  [place] [facility] DME Agency:  [facility]  HH Arranged:  [facility] Agency:  [facility]  Status of Service:  Completed, signed off  Medicare Important Message Given:    Date Medicare IM Given:    Medicare IM give by:    Date Additional Medicare IM Given:    Additional Medicare Important Message give by:     If discussed at Long Length of Stay Meetings, dates discussed:    Additional Comments:  Marily Memos, RN 07/15/2015, 9:48 AM

## 2015-08-23 ENCOUNTER — Other Ambulatory Visit: Payer: Self-pay | Admitting: Orthopedic Surgery

## 2015-08-23 DIAGNOSIS — Z96651 Presence of right artificial knee joint: Secondary | ICD-10-CM

## 2015-08-29 ENCOUNTER — Ambulatory Visit
Admission: RE | Admit: 2015-08-29 | Discharge: 2015-08-29 | Disposition: A | Discharge status: Home / Self Care | Payer: BLUE CROSS/BLUE SHIELD | Source: Ambulatory Visit | Attending: Orthopedic Surgery | Admitting: Orthopedic Surgery

## 2015-08-29 DIAGNOSIS — M1712 Unilateral primary osteoarthritis, left knee: Secondary | ICD-10-CM | POA: Insufficient documentation

## 2015-08-29 DIAGNOSIS — Z96651 Presence of right artificial knee joint: Secondary | ICD-10-CM

## 2015-09-20 ENCOUNTER — Other Ambulatory Visit: Payer: BLUE CROSS/BLUE SHIELD

## 2015-09-21 ENCOUNTER — Encounter
Admission: RE | Admit: 2015-09-21 | Discharge: 2015-09-21 | Disposition: A | Discharge status: Home / Self Care | Payer: BLUE CROSS/BLUE SHIELD | Source: Ambulatory Visit | Attending: Orthopedic Surgery | Admitting: Orthopedic Surgery

## 2015-09-21 DIAGNOSIS — Z01812 Encounter for preprocedural laboratory examination: Secondary | ICD-10-CM | POA: Insufficient documentation

## 2015-09-21 LAB — URINALYSIS COMPLETE WITH MICROSCOPIC (ARMC ONLY)
BILIRUBIN URINE: NEGATIVE
Bacteria, UA: NONE SEEN
GLUCOSE, UA: NEGATIVE mg/dL
HGB URINE DIPSTICK: NEGATIVE
KETONES UR: NEGATIVE mg/dL
Leukocytes, UA: NEGATIVE
NITRITE: NEGATIVE
Protein, ur: NEGATIVE mg/dL
RBC / HPF: NONE SEEN RBC/hpf (ref 0–5)
SPECIFIC GRAVITY, URINE: 1.006 (ref 1.005–1.030)
Squamous Epithelial / LPF: NONE SEEN
pH: 5 (ref 5.0–8.0)

## 2015-09-21 LAB — BASIC METABOLIC PANEL
ANION GAP: 9 (ref 5–15)
BUN: 11 mg/dL (ref 6–20)
CHLORIDE: 104 mmol/L (ref 101–111)
CO2: 26 mmol/L (ref 22–32)
Calcium: 9.5 mg/dL (ref 8.9–10.3)
Creatinine, Ser: 1.09 mg/dL (ref 0.61–1.24)
GFR calc Af Amer: >60 mL/min (ref >60)
Glucose, Bld: 94 mg/dL (ref 65–99)
POTASSIUM: 3.6 mmol/L (ref 3.5–5.1)
SODIUM: 139 mmol/L (ref 135–145)

## 2015-09-21 LAB — SEDIMENTATION RATE: Sed Rate: 5 mm/hr (ref 0–20)

## 2015-09-21 LAB — CBC
HEMATOCRIT: 44.5 % (ref 40.0–52.0)
HEMOGLOBIN: 14.5 g/dL (ref 13.0–18.0)
MCH: 27.2 pg (ref 26.0–34.0)
MCHC: 32.7 g/dL (ref 32.0–36.0)
MCV: 83.3 fL (ref 80.0–100.0)
Platelets: 290 10*3/uL (ref 150–440)
RBC: 5.34 MIL/uL (ref 4.40–5.90)
RDW: 13.8 % (ref 11.5–14.5)
WBC: 7 10*3/uL (ref 3.8–10.6)

## 2015-09-21 LAB — TYPE AND SCREEN
ABO/RH(D): O NEG
ANTIBODY SCREEN: NEGATIVE

## 2015-09-21 LAB — SURGICAL PCR SCREEN
MRSA, PCR: NEGATIVE
Staphylococcus aureus: NEGATIVE

## 2015-09-21 LAB — PROTIME-INR
INR: 1.06
PROTHROMBIN TIME: 14 s (ref 11.4–15.0)

## 2015-09-21 LAB — APTT: APTT: 33 s (ref 24–36)

## 2015-09-21 NOTE — Patient Instructions (Signed)
  Your procedure is scheduled on: Tuesday 10/04/2015 Report to Day Surgery. 2ND FLOOR MEDICAL MALL ENTRANCE To find out your arrival time please call [phone] between 1PM - 3PM on Monday 10/03/2015.  Remember: Instructions that are not followed completely may result in serious medical risk, up to and including death, or upon the discretion of your surgeon and anesthesiologist your surgery may need to be rescheduled.    __X__ 1. Do not eat food or drink liquids after midnight. No gum chewing or hard candies.     __X__ 2. No Alcohol for 24 hours before or after surgery.   ____ 3. Bring all medications with you on the day of surgery if instructed.    __X__ 4. Notify your doctor if there is any change in your medical condition     (cold, fever, infections).     Do not wear jewelry, make-up, hairpins, clips or nail polish.  Do not wear lotions, powders, or perfumes. .  Do not shave 48 hours prior to surgery. Men may shave face and neck.  Do not bring valuables to the hospital.    [facility] belongings or valuables.               Contacts, dentures or bridgework may not be worn into surgery.  Leave your suitcase in the car. After surgery it may be brought to your room.  For patients admitted to the hospital, discharge time is determined by your                treatment team.   Patients discharged the day of surgery will not be allowed to drive home.   Please read over the following fact sheets that you were given:   MRSA Information and Surgical Site Infection Prevention   ____ Take these medicines the morning of surgery with A SIP OF WATER:    1.   2.   3.   4.  5.  6.  ____ Fleet Enema (as directed)   __X__ Use CHG Soap as directed  ____ Use inhalers on the day of surgery  ____ Stop metformin 2 days prior to surgery    ____ Take 1/2 of usual insulin dose the night before surgery and none on the morning of surgery.   ____ Stop  Coumadin/Plavix/aspirin on   ____ Stop Anti-inflammatories on    ____ Stop supplements until after surgery.    ____ Bring C-Pap to the hospital.

## 2015-09-23 LAB — URINE CULTURE: CULTURE: NO GROWTH

## 2015-10-04 ENCOUNTER — Inpatient Hospital Stay: Payer: BLUE CROSS/BLUE SHIELD | Admitting: Anesthesiology

## 2015-10-04 ENCOUNTER — Inpatient Hospital Stay
Admission: RE | Admit: 2015-10-04 | Discharge: 2015-10-06 | DRG: 470 | Disposition: A | Discharge status: Home / Self Care | Payer: BLUE CROSS/BLUE SHIELD | Source: Ambulatory Visit | Attending: Orthopedic Surgery | Admitting: Orthopedic Surgery

## 2015-10-04 ENCOUNTER — Inpatient Hospital Stay: Payer: BLUE CROSS/BLUE SHIELD

## 2015-10-04 ENCOUNTER — Encounter
Admission: RE | Disposition: A | Discharge status: Home / Self Care | Payer: Self-pay | Source: Ambulatory Visit | Attending: Orthopedic Surgery

## 2015-10-04 DIAGNOSIS — I959 Hypotension, unspecified: Secondary | ICD-10-CM | POA: Diagnosis present

## 2015-10-04 DIAGNOSIS — I10 Essential (primary) hypertension: Secondary | ICD-10-CM | POA: Diagnosis present

## 2015-10-04 DIAGNOSIS — M1712 Unilateral primary osteoarthritis, left knee: Principal | ICD-10-CM | POA: Diagnosis present

## 2015-10-04 DIAGNOSIS — G8918 Other acute postprocedural pain: Secondary | ICD-10-CM

## 2015-10-04 DIAGNOSIS — M1710 Unilateral primary osteoarthritis, unspecified knee: Secondary | ICD-10-CM | POA: Diagnosis present

## 2015-10-04 DIAGNOSIS — K219 Gastro-esophageal reflux disease without esophagitis: Secondary | ICD-10-CM | POA: Diagnosis present

## 2015-10-04 HISTORY — PX: TOTAL KNEE ARTHROPLASTY: SHX125

## 2015-10-04 LAB — CBC
HEMATOCRIT: 38.3 % — AB (ref 40.0–52.0)
Hemoglobin: 12.5 g/dL — ABNORMAL LOW (ref 13.0–18.0)
MCH: 27 pg (ref 26.0–34.0)
MCHC: 32.6 g/dL (ref 32.0–36.0)
MCV: 82.8 fL (ref 80.0–100.0)
PLATELETS: 231 10*3/uL (ref 150–440)
RBC: 4.62 MIL/uL (ref 4.40–5.90)
RDW: 13.6 % (ref 11.5–14.5)
WBC: 6.2 10*3/uL (ref 3.8–10.6)

## 2015-10-04 LAB — CREATININE, SERUM
CREATININE: 1.11 mg/dL (ref 0.61–1.24)
GFR calc Af Amer: >60 mL/min (ref >60)

## 2015-10-04 SURGERY — ARTHROPLASTY, KNEE, TOTAL
Anesthesia: Spinal | Site: Knee | Laterality: Left | Wound class: Clean

## 2015-10-04 MED ORDER — ONDANSETRON HCL 4 MG PO TABS: 4.0000 mg | ORAL_TABLET | Freq: Four times a day (QID) | ORAL | Status: DC | PRN

## 2015-10-04 MED ORDER — MIDAZOLAM HCL 5 MG/5ML IJ SOLN
INTRAMUSCULAR | Status: DC | PRN
  Administered 2015-10-04: 2 mg via INTRAVENOUS

## 2015-10-04 MED ORDER — MORPHINE SULFATE (PF) 10 MG/ML IV SOLN
INTRAVENOUS | Status: AC
  Filled 2015-10-04: qty 1

## 2015-10-04 MED ORDER — MAGNESIUM CITRATE PO SOLN
1.0000 | Freq: Once | ORAL | Status: AC | PRN
  Administered 2015-10-06: 1 via ORAL
  Filled 2015-10-04 (×2): qty 296

## 2015-10-04 MED ORDER — FAMOTIDINE 20 MG PO TABS
20.0000 mg | ORAL_TABLET | Freq: Once | ORAL | Status: AC
  Administered 2015-10-04: 20 mg via ORAL

## 2015-10-04 MED ORDER — PHENYLEPHRINE HCL 10 MG/ML IJ SOLN
INTRAMUSCULAR | Status: DC | PRN
  Administered 2015-10-04 (×3): 100 ug via INTRAVENOUS

## 2015-10-04 MED ORDER — KETOROLAC TROMETHAMINE 30 MG/ML IJ SOLN
INTRAMUSCULAR | Status: DC | PRN
  Administered 2015-10-04: 30 mg

## 2015-10-04 MED ORDER — ENOXAPARIN SODIUM 30 MG/0.3ML ~~LOC~~ SOLN
30.0000 mg | Freq: Two times a day (BID) | SUBCUTANEOUS | Status: DC
  Administered 2015-10-05 – 2015-10-06 (×3): 30 mg via SUBCUTANEOUS
  Filled 2015-10-04 (×3): qty 0.3

## 2015-10-04 MED ORDER — MORPHINE SULFATE 10 MG/ML IJ SOLN
INTRAMUSCULAR | Status: DC | PRN
  Administered 2015-10-04: 10 mg

## 2015-10-04 MED ORDER — FENTANYL CITRATE (PF) 100 MCG/2ML IJ SOLN: 25.0000 ug | INTRAMUSCULAR | Status: DC | PRN

## 2015-10-04 MED ORDER — CEFAZOLIN SODIUM-DEXTROSE 2-3 GM-% IV SOLR
2.0000 g | Freq: Four times a day (QID) | INTRAVENOUS | Status: AC
  Administered 2015-10-04 (×3): 2 g via INTRAVENOUS
  Filled 2015-10-04 (×3): qty 50

## 2015-10-04 MED ORDER — NEOMYCIN-POLYMYXIN B GU 40-200000 IR SOLN
Status: DC | PRN
  Administered 2015-10-04: 16 mL

## 2015-10-04 MED ORDER — ACETAMINOPHEN 325 MG PO TABS: 650.0000 mg | ORAL_TABLET | Freq: Four times a day (QID) | ORAL | Status: DC | PRN

## 2015-10-04 MED ORDER — METHOCARBAMOL 500 MG PO TABS: 500.0000 mg | ORAL_TABLET | Freq: Four times a day (QID) | ORAL | Status: DC | PRN

## 2015-10-04 MED ORDER — DOCUSATE SODIUM 100 MG PO CAPS
100.0000 mg | ORAL_CAPSULE | Freq: Two times a day (BID) | ORAL | Status: DC
  Administered 2015-10-04 – 2015-10-06 (×5): 100 mg via ORAL
  Filled 2015-10-04 (×5): qty 1

## 2015-10-04 MED ORDER — SODIUM CHLORIDE 0.9 % IV SOLN
INTRAVENOUS | Status: DC
  Administered 2015-10-04: 19:00:00 via INTRAVENOUS

## 2015-10-04 MED ORDER — MORPHINE SULFATE (PF) 2 MG/ML IV SOLN: 2.0000 mg | INTRAVENOUS | Status: DC | PRN

## 2015-10-04 MED ORDER — SODIUM CHLORIDE 0.9 % IV SOLN
INTRAVENOUS | Status: DC | PRN
  Administered 2015-10-04: 60 mL

## 2015-10-04 MED ORDER — TRANEXAMIC ACID 1000 MG/10ML IV SOLN
1000.0000 mg | INTRAVENOUS | Status: AC
  Administered 2015-10-04: 1000 mg via INTRAVENOUS
  Filled 2015-10-04: qty 10

## 2015-10-04 MED ORDER — LACTATED RINGERS IV SOLN
INTRAVENOUS | Status: DC
  Administered 2015-10-04 (×3): via INTRAVENOUS

## 2015-10-04 MED ORDER — ONDANSETRON HCL 4 MG/2ML IJ SOLN: 4.0000 mg | Freq: Once | INTRAMUSCULAR | Status: DC | PRN

## 2015-10-04 MED ORDER — AMLODIPINE BESYLATE 10 MG PO TABS
10.0000 mg | ORAL_TABLET | Freq: Every day | ORAL | Status: DC
  Administered 2015-10-04: 10 mg via ORAL
  Filled 2015-10-04 (×2): qty 1

## 2015-10-04 MED ORDER — CEFAZOLIN SODIUM-DEXTROSE 2-3 GM-% IV SOLR: 2.0000 g | Freq: Once | INTRAVENOUS | Status: DC

## 2015-10-04 MED ORDER — BUPIVACAINE-EPINEPHRINE (PF) 0.25% -1:200000 IJ SOLN
INTRAMUSCULAR | Status: DC | PRN
  Administered 2015-10-04: 30 mL

## 2015-10-04 MED ORDER — METHOCARBAMOL 1000 MG/10ML IJ SOLN: 500.0000 mg | Freq: Four times a day (QID) | INTRAVENOUS | Status: DC | PRN

## 2015-10-04 MED ORDER — FAMOTIDINE 20 MG PO TABS
ORAL_TABLET | ORAL | Status: AC
  Administered 2015-10-04: 20 mg via ORAL
  Filled 2015-10-04: qty 1

## 2015-10-04 MED ORDER — MAGNESIUM HYDROXIDE 400 MG/5ML PO SUSP
30.0000 mL | Freq: Every day | ORAL | Status: DC | PRN
  Administered 2015-10-05: 30 mL via ORAL
  Filled 2015-10-04: qty 30

## 2015-10-04 MED ORDER — ZOLPIDEM TARTRATE 5 MG PO TABS: 5.0000 mg | ORAL_TABLET | Freq: Every evening | ORAL | Status: DC | PRN

## 2015-10-04 MED ORDER — MENTHOL 3 MG MT LOZG
1.0000 | LOZENGE | OROMUCOSAL | Status: DC | PRN
  Administered 2015-10-04: 3 mg via ORAL
  Filled 2015-10-04: qty 9

## 2015-10-04 MED ORDER — ONDANSETRON HCL 4 MG/2ML IJ SOLN
4.0000 mg | Freq: Four times a day (QID) | INTRAMUSCULAR | Status: DC | PRN
Start: 2015-10-04 — End: 2015-10-06

## 2015-10-04 MED ORDER — BISACODYL 10 MG RE SUPP: 10.0000 mg | Freq: Every day | RECTAL | Status: DC | PRN

## 2015-10-04 MED ORDER — KETAMINE HCL 50 MG/ML IJ SOLN
INTRAMUSCULAR | Status: DC | PRN
  Administered 2015-10-04: 50 mg via INTRAMUSCULAR

## 2015-10-04 MED ORDER — OXYCODONE HCL 5 MG PO TABS
5.0000 mg | ORAL_TABLET | ORAL | Status: DC | PRN
  Administered 2015-10-04 – 2015-10-05 (×3): 5 mg via ORAL
  Administered 2015-10-05 (×2): 10 mg via ORAL
  Administered 2015-10-05: 5 mg via ORAL
  Administered 2015-10-05 – 2015-10-06 (×3): 10 mg via ORAL
  Filled 2015-10-04 (×3): qty 1
  Filled 2015-10-04 (×2): qty 2
  Filled 2015-10-04 (×2): qty 1
  Filled 2015-10-04 (×3): qty 2

## 2015-10-04 MED ORDER — CEFAZOLIN SODIUM-DEXTROSE 2-3 GM-% IV SOLR
INTRAVENOUS | Status: AC
  Administered 2015-10-04: 2 g via INTRAVENOUS
  Filled 2015-10-04: qty 50

## 2015-10-04 MED ORDER — PHENOL 1.4 % MT LIQD: 1.0000 | OROMUCOSAL | Status: DC | PRN

## 2015-10-04 MED ORDER — SODIUM CHLORIDE 0.9 % IJ SOLN
INTRAMUSCULAR | Status: AC
  Filled 2015-10-04: qty 50

## 2015-10-04 MED ORDER — SODIUM CHLORIDE 0.9 % IV BOLUS (SEPSIS)
500.0000 mL | Freq: Once | INTRAVENOUS | Status: AC
  Administered 2015-10-04: 500 mL via INTRAVENOUS

## 2015-10-04 MED ORDER — TRAMADOL HCL 50 MG PO TABS
50.0000 mg | ORAL_TABLET | Freq: Four times a day (QID) | ORAL | Status: DC | PRN
  Administered 2015-10-04: 50 mg via ORAL
  Filled 2015-10-04: qty 1

## 2015-10-04 MED ORDER — DIPHENHYDRAMINE HCL 12.5 MG/5ML PO ELIX: 12.5000 mg | ORAL_SOLUTION | ORAL | Status: DC | PRN

## 2015-10-04 MED ORDER — ACETAMINOPHEN 650 MG RE SUPP: 650.0000 mg | Freq: Four times a day (QID) | RECTAL | Status: DC | PRN

## 2015-10-04 MED ORDER — PROPOFOL 500 MG/50ML IV EMUL
INTRAVENOUS | Status: DC | PRN
  Administered 2015-10-04: 100 ug/kg/min via INTRAVENOUS

## 2015-10-04 MED ORDER — METOCLOPRAMIDE HCL 5 MG PO TABS: 5.0000 mg | ORAL_TABLET | Freq: Three times a day (TID) | ORAL | Status: DC | PRN

## 2015-10-04 MED ORDER — BUPIVACAINE HCL (PF) 0.5 % IJ SOLN
INTRAMUSCULAR | Status: DC | PRN
  Administered 2015-10-04: 3.5 mL

## 2015-10-04 MED ORDER — PROPOFOL 10 MG/ML IV BOLUS
INTRAVENOUS | Status: DC | PRN
  Administered 2015-10-04: 30 mg via INTRAVENOUS
  Administered 2015-10-04: 50 mg via INTRAVENOUS

## 2015-10-04 MED ORDER — METOCLOPRAMIDE HCL 5 MG/ML IJ SOLN: 5.0000 mg | Freq: Three times a day (TID) | INTRAMUSCULAR | Status: DC | PRN

## 2015-10-04 SURGICAL SUPPLY — 53 items
BANDAGE ACE 6X5 VEL STRL LF (GAUZE/BANDAGES/DRESSINGS) ×3 IMPLANT
BLADE SAW 1 (BLADE) ×3 IMPLANT
BLOCK CUTTING FEMUR SZ6 LEFT (MISCELLANEOUS) IMPLANT
BLOCK CUTTING TIBIAL 6 LT (MISCELLANEOUS) IMPLANT
BOWL CEMENT MIX W SPATULA BONE (MISCELLANEOUS) ×3 IMPLANT
CANISTER SUCT 1200ML W/VALVE (MISCELLANEOUS) ×3 IMPLANT
CANISTER SUCT 3000ML (MISCELLANEOUS) ×6 IMPLANT
CAPT KNEE TOTAL 3 ×3 IMPLANT
CATH FOL LEG HOLDER (MISCELLANEOUS) ×3 IMPLANT
CATH TRAY METER 16FR LF (MISCELLANEOUS) ×3 IMPLANT
CEMENT HV SMART SET (Cement) ×9 IMPLANT
CHLORAPREP W/TINT 26ML (MISCELLANEOUS) ×6 IMPLANT
COOLER POLAR GLACIER W/PUMP (MISCELLANEOUS) ×3 IMPLANT
DRAPE INCISE IOBAN 66X45 STRL (DRAPES) ×6 IMPLANT
DRAPE SHEET LG 3/4 BI-LAMINATE (DRAPES) ×6 IMPLANT
ELECT CAUTERY BLADE 6.4 (BLADE) ×3 IMPLANT
GAUZE PETRO XEROFOAM 1X8 (MISCELLANEOUS) ×3 IMPLANT
GAUZE SPONGE 4X4 12PLY STRL (GAUZE/BANDAGES/DRESSINGS) ×3 IMPLANT
GLOVE BIOGEL PI IND STRL 9 (GLOVE) ×1 IMPLANT
GLOVE BIOGEL PI INDICATOR 9 (GLOVE) ×2
GLOVE SURG ORTHO 9.0 STRL STRW (GLOVE) ×3 IMPLANT
GOWN SPECIALTY ULTRA XL (MISCELLANEOUS) ×3 IMPLANT
GOWN STRL REUS W/ TWL LRG LVL3 (GOWN DISPOSABLE) ×2 IMPLANT
GOWN STRL REUS W/TWL LRG LVL3 (GOWN DISPOSABLE) ×4
HANDPIECE SUCTION TUBG SURGILV (MISCELLANEOUS) ×3 IMPLANT
HOOD PEEL AWAY FACE SHEILD DIS (HOOD) ×6 IMPLANT
IMMBOLIZER KNEE 19 BLUE UNIV (SOFTGOODS) ×3 IMPLANT
IV SET EXTENSION 6 LL TADAPT (SET/KITS/TRAYS/PACK) ×3 IMPLANT
KNEE MEDACTA TIBIAL/FEMORAL BL (Knees) ×3 IMPLANT
KNIFE SCULPS 14X20 (INSTRUMENTS) ×3 IMPLANT
MEDACTA MY KNEE LEFT SIDE FEMUR BONE MODEL ×3 IMPLANT
NDL SAFETY 18GX1.5 (NEEDLE) ×3 IMPLANT
NEEDLE SPNL 18GX3.5 QUINCKE PK (NEEDLE) ×3 IMPLANT
NEEDLE SPNL 20GX3.5 QUINCKE YW (NEEDLE) ×3 IMPLANT
NS IRRIG 1000ML POUR BTL (IV SOLUTION) ×3 IMPLANT
PACK TOTAL KNEE (MISCELLANEOUS) ×3 IMPLANT
PAD GROUND ADULT SPLIT (MISCELLANEOUS) ×3 IMPLANT
PAD WRAPON POLAR KNEE (MISCELLANEOUS) ×1 IMPLANT
SOL .9 NS 3000ML IRR  AL (IV SOLUTION) ×2
SOL .9 NS 3000ML IRR UROMATIC (IV SOLUTION) ×1 IMPLANT
STAPLER SKIN PROX 35W (STAPLE) ×3 IMPLANT
STRAP SAFETY BODY (MISCELLANEOUS) ×3 IMPLANT
SUCTION FRAZIER TIP 10 FR DISP (SUCTIONS) ×3 IMPLANT
SUT DVC 2 QUILL PDO  T11 36X36 (SUTURE) ×2
SUT DVC 2 QUILL PDO T11 36X36 (SUTURE) ×1 IMPLANT
SUT DVC QUILL MONODERM 30X30 (SUTURE) ×3 IMPLANT
SUT ETHIBOND NAB CT1 #1 30IN (SUTURE) ×3 IMPLANT
SYR 20CC LL (SYRINGE) ×3 IMPLANT
SYR 50ML LL SCALE MARK (SYRINGE) ×3 IMPLANT
TIBIAL BONE MODEL LEFT (MISCELLANEOUS) IMPLANT
TOWER CARTRIDGE SMART MIX (DISPOSABLE) ×3 IMPLANT
WATER STERILE IRR 1000ML POUR (IV SOLUTION) ×3 IMPLANT
WRAPON POLAR PAD KNEE (MISCELLANEOUS) ×3

## 2015-10-04 NOTE — Progress Notes (Signed)
Notified Dr Rosita KeaMenz of low blood pressure. 500 cc NS bolus ordered

## 2015-10-04 NOTE — Transfer of Care (Signed)
Immediate Anesthesia Transfer of Care Note  Patient: Johnathan LeventhalWilliam Gubbels  Procedure(s) Performed: Procedure(s): TOTAL KNEE ARTHROPLASTY (Left)  Patient Location: PACU  Anesthesia Type:Spinal  Level of Consciousness: sedated  Airway & Oxygen Therapy: Patient Spontanous Breathing and Patient connected to face mask oxygen  Post-op Assessment: Report given to RN and Post -op Vital signs reviewed and stable  Post vital signs: Reviewed and stable  Last Vitals:  Filed Vitals:   10/04/15 0612 10/04/15 1000  BP: 158/85 90/51  Pulse: 76 70  Temp: 36.5 C 36.4 C  Resp: 16 14    Complications: No apparent anesthesia complications

## 2015-10-04 NOTE — Anesthesia Procedure Notes (Addendum)
Date/Time: 10/04/2015 7:42 AM Performed by: Nelda Marseille Pre-anesthesia Checklist: Patient identified, Emergency Drugs available, Suction available, Patient being monitored and Timeout performed Oxygen Delivery Method: Simple face mask   Spinal Patient location during procedure: OR Start time: 10/04/2015 7:15 AM End time: 10/04/2015 7:20 AM Staffing Resident/CRNA: Nelda Marseille Performed by: resident/CRNA  Preanesthetic Checklist Completed: patient identified, site marked, surgical consent, pre-op evaluation, timeout performed, IV checked, risks and benefits discussed and monitors and equipment checked Spinal Block Patient position: sitting Prep: Betadine Patient monitoring: heart rate, continuous pulse ox, blood pressure and cardiac monitor Approach: midline Location: L4-5 Injection technique: single-shot Needle Needle type: Whitacre and Introducer  Needle gauge: 24 G Needle length: 9 cm Additional Notes Negative paresthesia. Negative blood return. Positive free-flowing CSF. Expiration date of kit checked and confirmed. Patient tolerated procedure well, without complications.

## 2015-10-04 NOTE — Anesthesia Preprocedure Evaluation (Addendum)
Anesthesia Evaluation  Patient identified by MRN, date of birth, ID band Patient awake    Reviewed: Allergy & Precautions, NPO status , Patient's Chart, lab work & pertinent test results  Airway Mallampati: I       Dental no notable dental hx.    Pulmonary neg pulmonary ROS, former smoker,    Pulmonary exam normal        Cardiovascular Exercise Tolerance: Good hypertension, Pt. on medications  Rhythm:Regular Rate:Normal     Neuro/Psych    GI/Hepatic Neg liver ROS, GERD  ,  Endo/Other  negative endocrine ROS  Renal/GU negative Renal ROS     Musculoskeletal   Abdominal Normal abdominal exam  (+)   Peds  Hematology negative hematology ROS (+)   Anesthesia Other Findings   Reproductive/Obstetrics                            Anesthesia Physical Anesthesia Plan  ASA: II  Anesthesia Plan: Spinal   Post-op Pain Management:    Induction: Intravenous  Airway Management Planned: Nasal Cannula  Additional Equipment:   Intra-op Plan:   Post-operative Plan:   Informed Consent: I have reviewed the patients History and Physical, chart, labs and discussed the procedure including the risks, benefits and alternatives for the proposed anesthesia with the patient or authorized representative who has indicated his/her understanding and acceptance.     Plan Discussed with: CRNA  Anesthesia Plan Comments:         Anesthesia Quick Evaluation

## 2015-10-04 NOTE — Op Note (Signed)
10/04/2015  10:06 AM  PATIENT:  Johnathan Salas  59 y.o. male  PRE-OPERATIVE DIAGNOSIS:  PRIMARY OSTEOARTHRITIS  left knee  POST-OPERATIVE DIAGNOSIS:  PRIMARY OSTEOARTHRITIS  left knee  PROCEDURE:  Procedure(s): TOTAL KNEE ARTHROPLASTY (Left)  SURGEON: Leitha SchullerMichael J Xayvion Shirah, MD  ASSISTANTS: [place] Neighborhris Gaines [facility]  ANESTHESIA:   spinal  EBL:  Total I/O In: 1400 [I.V.:1400] Out: 500 [Urine:450; Blood:50]  BLOOD ADMINISTERED:none  DRAINS: none   LOCAL MEDICATIONS USED:  MARCAINE    and OTHER Toradol morphine, exparel  SPECIMEN:  Source of Specimen:  Cut ends of bone  DISPOSITION OF SPECIMEN:  PATHOLOGY  COUNTS:  YES  TOURNIQUET:   98 minutes at 300 mmHg  IMPLANTS: Medacta GMK sphere system, left 6 femur, left 5 tibia with a 10 mm insert, 4 patella, all components cemented  DICTATION: .Dragon Dictation patient brought the operating room and after adequate anesthesia was obtained the left leg was prepped and draped in sterile fashion. After patient education timeout procedures were completed a midline skin incision was made followed by medial parapatellar arthrotomy. Inspection of the joint revealed the sclerotic bone in the medial compartment and patellofemoral joint with mild lateral changes. The anterior cruciate ligament Were excised. Proximal tibia and distal femur was prepared and getting down to the bone through any residual cartilage cutting blocks applied and the distal femoral cut made along the proximal tibia cut the joint was quite stiff and after removal of the the bone could be mobilized adequately to excise the PCL. 4-in-1 cutting block was applied anterior posterior and chamfer cuts made on the distal femur this femoral trial was placed distal drill holes made and tibial baseplate placed after preparing with proximal drilling of the tibia with keel punch then applied.. A 10 mm insert gave good stability. Next the patella was cut after having performed the trochlear groove reaming.  Patella was cut and measured a size 4 after drill holes were made. This point the knee was infiltrated with the local anesthetic mentioned above with dilute exparel along with a mixture of Sensorcaine Toradol morphine. The bony surfaces were then irrigated and dried and the components cemented in with the final tibial baseplate set screw inserted. After the cement set excess cement was removed and the knee irrigated out with a dilute Betadine solution as well as pulsatile lavage. The arthrotomy was repaired using a heavy Quill after letting the tourniquet down there is no significant bleeding. To a Quill substantially and skin staples. Xeroform 4 x 4 web roll Polar Care and ABDs applied along with an Ace wrap patient center comes stable condition  PLAN OF CARE: Admit to inpatient   PATIENT DISPOSITION:  PACU - hemodynamically stable.

## 2015-10-04 NOTE — Anesthesia Postprocedure Evaluation (Signed)
Anesthesia Post Note  Patient: Johnathan LeventhalWilliam Salas  Procedure(s) Performed: Procedure(s) (LRB): TOTAL KNEE ARTHROPLASTY (Left)  Patient location during evaluation: PACU Anesthesia Type: Spinal Level of consciousness: awake Pain management: pain level controlled Vital Signs Assessment: post-procedure vital signs reviewed and stable Respiratory status: spontaneous breathing Cardiovascular status: stable Anesthetic complications: no    Last Vitals:  Filed Vitals:   10/04/15 1100 10/04/15 1129  BP: 100/66 111/69  Pulse: 59 57  Temp: 36.6 C 36.4 C  Resp: 16     Last Pain:  Filed Vitals:   10/04/15 1130  PainSc: 0-No pain    LLE Motor Response: Purposeful movement LLE Sensation: Decreased RLE Motor Response: Purposeful movement RLE Sensation: Decreased      VAN STAVEREN,Dabid [place]

## 2015-10-04 NOTE — Transfer of Care (Signed)
Immediate Anesthesia Transfer of Care Note  Patient: Johnathan Salas  Procedure(s) Performed: Procedure(s): TOTAL KNEE ARTHROPLASTY (Left)  Patient Location: PACU  Anesthesia Type:Spinal  Level of Consciousness: sedated  Airway & Oxygen Therapy: Patient Spontanous Breathing and Patient connected to face mask oxygen  Post-op Assessment: Report given to RN and Post -op Vital signs reviewed and stable  Post vital signs: Reviewed and stable  Last Vitals:  Filed Vitals:   10/04/15 0612  BP: 158/85  Pulse: 76  Temp: 36.5 C  Resp: 16    Complications: No apparent anesthesia complications

## 2015-10-04 NOTE — H&P (Signed)
Reviewed paper H+P, will be scanned into chart. No changes noted.  

## 2015-10-04 NOTE — Evaluation (Signed)
Physical Therapy Evaluation Patient Details Name: Johnathan LeventhalWilliam Vig MRN: [id number] DOB: 05/01/1956 Today's Date: 10/04/2015   History of Present Illness  Pt underwent L TKR and is POD#0 at time of evaluation. No reported post-op complications. Pt recently underwent R TKR 07/2015.   Clinical Impression  Pt demonstrates excellent mobility and strength for POD#0. He is able to perform bed mobility, transfers, and ambulation with CGA only. Good seated and standing balance and AAROM L knee is excellent at 0-76 degrees. Pt able to complete all exercises as instructed and demonstrates full L SLR without assist. Pt would like to go straight to OP PT instead of initial HH PT. Pt will benefit from skilled PT services to address deficits in strength, balance, and mobility in order to return to full function at home.     Follow Up Recommendations Outpatient PT (Pt would like outpatient PT, doesn't want HH PT)    Equipment Recommendations  None recommended by PT    Recommendations for Other Services       Precautions / Restrictions Precautions Precautions: Knee Precaution Booklet Issued: Yes (comment) Required Braces or Orthoses: Knee Immobilizer - Left Knee Immobilizer - Left: Discontinue once straight leg raise with < 10 degree lag Restrictions Weight Bearing Restrictions: Yes LLE Weight Bearing: Weight bearing as tolerated      Mobility  Bed Mobility Overal bed mobility: Needs Assistance Bed Mobility: Supine to Sit     Supine to sit: Min guard     General bed mobility comments: Pt demonstrates good LLE abduction and hip flexion strength. Minimal pain reported with bed mobility. HOB elevated and bed rails utilized.  Transfers Overall transfer level: Needs assistance Equipment used: Rolling walker (2 wheeled) Transfers: Sit to/from Stand Sit to Stand: Min guard         General transfer comment: Pt demonstrates good weight acceptance to LLE during transfer. Good anterior weight  shifting and good standing stability noted. Safe hand placement  Ambulation/Gait Ambulation/Gait assistance: Min guard Ambulation Distance (Feet): 5 Feet Assistive device: Rolling walker (2 wheeled) Gait Pattern/deviations: Step-to pattern Gait velocity: Decreased Gait velocity interpretation: <1.8 ft/sec, indicative of risk for recurrent falls General Gait Details: Pt demonstrates safe and steady ambulation from bed to recliner. Good weight acceptance into LLE. Gait speed decreased but good for POD#0.  Stairs            Wheelchair Mobility    Modified Rankin (Stroke Patients Only)       Balance Overall balance assessment: Needs assistance   Sitting balance-Leahy Scale: Good       Standing balance-Leahy Scale: Fair                               Pertinent Vitals/Pain Pain Assessment: 0-10 Pain Score: 6  Pain Location: L knee Pain Descriptors / Indicators: Aching Pain Intervention(s): Monitored during session    Home Living Family/patient expects to be discharged to:: Private residence Living Arrangements: Spouse/significant other;Children Available Help at Discharge: Family;Available 24 hours/day Type of Home: House Home Access: Stairs to enter Entrance Stairs-Rails: None Entrance Stairs-Number of Steps: 1 Home Layout: Two level;Able to live on main level with bedroom/bathroom Home Equipment: Dan HumphreysWalker - 2 wheels;Cane - single point;Bedside commode;Tub bench Additional Comments: Good support and preparation at home    Prior Function Level of Independence: Independent         Comments: works full time     Hand Dominance   Dominant Hand:  Right    Extremity/Trunk Assessment   Upper Extremity Assessment: Overall WFL for tasks assessed           Lower Extremity Assessment: LLE deficits/detail   LLE Deficits / Details: Independent SLR and SAQ. Full DF/PF. Pt reprots some tingling in LLE but fully intact sensation to light touch. RLE  strength WFL     Communication   Communication: No difficulties  Cognition Arousal/Alertness: Awake/alert Behavior During Therapy: WFL for tasks assessed/performed Overall Cognitive Status: Within Functional Limits for tasks assessed                      General Comments      Exercises Total Joint Exercises Ankle Circles/Pumps: Strengthening;Both;15 reps;Supine Quad Sets: Strengthening;Both;15 reps;Supine Gluteal Sets: Strengthening;Both;15 reps;Supine Towel Squeeze: Strengthening;Both;15 reps;Supine Short Arc Quad: Strengthening;Left;10 reps;Supine Heel Slides: Strengthening;Left;10 reps;Supine Hip ABduction/ADduction: Strengthening;Left;10 reps;Supine Straight Leg Raises: Strengthening;Left;10 reps;Supine Goniometric ROM: 0-76 degrees AAROM, pain limited      Assessment/Plan    PT Assessment Patient needs continued PT services  PT Diagnosis Difficulty walking;Abnormality of gait;Generalized weakness;Acute pain   PT Problem List Decreased strength;Decreased range of motion;Decreased activity tolerance;Decreased balance;Decreased mobility;Pain  PT Treatment Interventions DME instruction;Gait training;Stair training;Therapeutic exercise;Balance training;Therapeutic activities;Neuromuscular re-education;Patient/family education;Manual techniques   PT Goals (Current goals can be found in the Care Plan section) Acute Rehab PT Goals Patient Stated Goal: To return to playing basketball with son PT Goal Formulation: With patient Time For Goal Achievement: 10/18/15 Potential to Achieve Goals: Good    Frequency BID   Barriers to discharge        Co-evaluation               End of Session Equipment Utilized During Treatment: Gait belt Activity Tolerance: Patient tolerated treatment well Patient left: in chair;with call bell/phone within reach;with chair alarm set;with family/visitor present;with SCD's reapplied (towel roll under heel, polar care in place) Nurse  Communication: Mobility status (CNA)         Time: 1308-6578 PT Time Calculation (min) (ACUTE ONLY): 27 min   Charges:   PT Evaluation $Initial PT Evaluation Tier I: 1 Procedure PT Treatments $Therapeutic Exercise: 8-22 mins   PT G Codes:       Sharalyn Ink Huprich PT, DPT  Huprich,Jason 10/04/2015, 4:28 PM

## 2015-10-05 ENCOUNTER — Encounter: Payer: Self-pay | Admitting: Orthopedic Surgery

## 2015-10-05 LAB — CBC
HCT: 34.9 % — ABNORMAL LOW (ref 40.0–52.0)
HEMOGLOBIN: 11.4 g/dL — AB (ref 13.0–18.0)
MCH: 26.8 pg (ref 26.0–34.0)
MCHC: 32.6 g/dL (ref 32.0–36.0)
MCV: 81.9 fL (ref 80.0–100.0)
Platelets: 207 10*3/uL (ref 150–440)
RBC: 4.26 MIL/uL — ABNORMAL LOW (ref 4.40–5.90)
RDW: 13.6 % (ref 11.5–14.5)
WBC: 8.6 10*3/uL (ref 3.8–10.6)

## 2015-10-05 LAB — BASIC METABOLIC PANEL
Anion gap: 5 (ref 5–15)
BUN: 10 mg/dL (ref 6–20)
CHLORIDE: 105 mmol/L (ref 101–111)
CO2: 24 mmol/L (ref 22–32)
CREATININE: 0.99 mg/dL (ref 0.61–1.24)
Calcium: 8 mg/dL — ABNORMAL LOW (ref 8.9–10.3)
GFR calc Af Amer: >60 mL/min (ref >60)
GFR calc non Af Amer: >60 mL/min (ref >60)
GLUCOSE: 120 mg/dL — AB (ref 65–99)
POTASSIUM: 3.7 mmol/L (ref 3.5–5.1)
Sodium: 134 mmol/L — ABNORMAL LOW (ref 135–145)

## 2015-10-05 MED ORDER — BISACODYL 5 MG PO TBEC
10.0000 mg | DELAYED_RELEASE_TABLET | Freq: Every day | ORAL | Status: DC | PRN
  Administered 2015-10-05: 10 mg via ORAL
  Filled 2015-10-05: qty 2

## 2015-10-05 NOTE — Plan of Care (Signed)
Problem: Pain Management: Goal: Pain level will decrease with appropriate interventions Outcome: Progressing Minimal pain. Pain control with oral pain medications.  Problem: Safety: Goal: Ability to remain free from injury will improve Outcome: Completed/Met Date Met:  10/05/15 No falls this shift, able to make needs known and uses call bell.  Problem: Fluid Volume: Goal: Ability to maintain a balanced intake and output will improve Outcome: Progressing Tolerating po fluids without difficulty.  Problem: Nutrition: Goal: Adequate nutrition will be maintained Outcome: Completed/Met Date Met:  10/05/15 Eating and drinking without difficulty.

## 2015-10-05 NOTE — Progress Notes (Signed)
Physical Therapy Treatment Patient Details Name: Johnathan Salas MRN: [id number] DOB: 09-Jul-1956 Today's Date: 10/05/2015    History of Present Illness Pt underwent L TKR and is POD#0 at time of evaluation. No reported post-op complications. Pt recently underwent R TKR 07/2015.     PT Comments    Patient initially complains of L groin pain in C-shaped arc from greater trochanter to medial thigh. PT palpated thigh and found no palpable mass, thigh was firm throughout though non-tender. Patient reported improved symptoms with soft tissue mobilization and ambulation. No difficulty with weight bearing, and no loss of sensation in LLE. Given that his symptoms improved with mobility and he does not display any of the major red-flags for acute change, suspect this may be related to stiffness from laying in bed for prolonged period. Patient tolerates session well and demonstrates appropriate gait mechanics with KI in place, no loss of balance noted. Patient is progressing well towards established mobility goals.   Follow Up Recommendations  Outpatient PT (Pt declines HHPT)     Equipment Recommendations  None recommended by PT    Recommendations for Other Services       Precautions / Restrictions Precautions Precautions: Knee Precaution Booklet Issued: Yes (comment) Required Braces or Orthoses: Knee Immobilizer - Left Knee Immobilizer - Left: Discontinue once straight leg raise with < 10 degree lag (Unable to complete 10 SLRs, applied knee immobilizer) Restrictions Weight Bearing Restrictions: Yes LLE Weight Bearing: Weight bearing as tolerated    Mobility  Bed Mobility Overal bed mobility: Needs Assistance Bed Mobility: Supine to Sit     Supine to sit: Min guard     General bed mobility comments: Patient is able to complete bed mobility by pushing his fists into the bed and bringing his LEs over the L side of the bed.   Transfers Overall transfer level: Needs assistance Equipment  used: Rolling walker (2 wheeled) Transfers: Sit to/from Stand Sit to Stand: Min guard         General transfer comment: Patient demonstrates appropriate hand placement after cuing and good weight acceptance onto LLE during transfer.   Ambulation/Gait Ambulation/Gait assistance: Supervision Ambulation Distance (Feet): 75 Feet Assistive device: Rolling walker (2 wheeled) Gait Pattern/deviations: Step-through pattern Gait velocity: Decreased   General Gait Details: Pt ambulated with step through pattern with knee immobilizer with no loss of balance and appropriate gait speed.    Stairs            Wheelchair Mobility    Modified Rankin (Stroke Patients Only)       Balance Overall balance assessment: Needs assistance Sitting-balance support: No upper extremity supported Sitting balance-Leahy Scale: Good     Standing balance support: Bilateral upper extremity supported Standing balance-Leahy Scale: Fair                      Cognition Arousal/Alertness: Awake/alert Behavior During Therapy: WFL for tasks assessed/performed Overall Cognitive Status: Within Functional Limits for tasks assessed                      Exercises Total Joint Exercises Heel Slides: AROM;AAROM;Both;10 reps Hip ABduction/ADduction: AROM;AAROM;10 reps;Both Straight Leg Raises: AROM;AAROM;Both;10 reps Goniometric ROM: 0-68 pain limited    General Comments General comments (skin integrity, edema, etc.): Palpated groin region where patient complained of pain, no palpable mass though tension noted throughout the thigh. Relieved tension/pressure with soft tissue mobilization.       Pertinent Vitals/Pain  Home Living                      Prior Function            PT Goals (current goals can now be found in the care plan section) Acute Rehab PT Goals Patient Stated Goal: To return to playing basketball with son PT Goal Formulation: With patient Time For Goal  Achievement: 10/18/15 Potential to Achieve Goals: Good Progress towards PT goals: Progressing toward goals    Frequency  BID    PT Plan Current plan remains appropriate    Co-evaluation             End of Session Equipment Utilized During Treatment: Gait belt Activity Tolerance: Patient tolerated treatment well Patient left: in chair;with call bell/phone within reach;with SCD's reapplied (Declined chair alarm)     Time: [id number] PT Time Calculation (min) (ACUTE ONLY): 28 min  Charges:  $Gait Training: 8-22 mins $Therapeutic Exercise: 8-22 mins                    G Codes:      Kerin RansomPatrick A McNamara, PT, DPT    10/05/2015, 2:18 PM

## 2015-10-05 NOTE — Care Management (Signed)
Patient POD1 status post TKA.  Patient lives at home with his wife and 2 sons.  Patient has a RW, cane, elevated toilet seat and shower chair from previous TKA.   Patient states that he plans to go to outpatient PT as he gets "more bang for my buck".  Patient states that his wife will transport him to PT and followup visits.  RNCM to follow for discharge planning.  No anticipated needs.

## 2015-10-05 NOTE — Progress Notes (Signed)
Patient's up and ambulating in hallway with PT.  

## 2015-10-05 NOTE — Progress Notes (Signed)
Pt not able to tolerate bone foam at this time. States he will put it back on later on this morning.

## 2015-10-05 NOTE — Progress Notes (Signed)
Physical Therapy Treatment Patient Details Name: Johnathan Salas MRN: [id number] DOB: [birth year] Today's Date: 10/05/2015    History of Present Illness Pt underwent L TKR and is POD#0 at time of evaluation. No reported post-op complications. Pt recently underwent R TKR 07/2015.     PT Comments    Pt is progressing well towards all mobility goals and is able to ambulate around RN station with RW and knee immobilizer in place. Patient deemed to have sufficient quad strength after ambulating that attempt at ambulation without KI provided, patient able to ambulate 46' with RW and no loss of balance. Patient demonstrates decreased knee flexion during swing phase initiation and required cuing to increase as tolerated. Patient will need to complete 2 steps with unilateral railing prior to meeting all PT related mobility goals.   Follow Up Recommendations  Outpatient PT     Equipment Recommendations  None recommended by PT    Recommendations for Other Services       Precautions / Restrictions Precautions Precautions: Knee Precaution Booklet Issued: Yes (comment) Required Braces or Orthoses: Knee Immobilizer - Left Knee Immobilizer - Left: Discontinue once straight leg raise with < 10 degree lag Restrictions Weight Bearing Restrictions: Yes LLE Weight Bearing: Weight bearing as tolerated    Mobility  Bed Mobility Overal bed mobility: Needs Assistance Bed Mobility: Sit to Supine;Supine to Sit     Supine to sit: Min guard Sit to supine: Supervision   General bed mobility comments: Pt educated on using his RLE to hook underneath his LLE for transfers, he is able to control LEs through this and bring his trunk to the side using his UEs.   Transfers Overall transfer level: Needs assistance Equipment used: Rolling walker (2 wheeled) Transfers: Sit to/from Stand Sit to Stand: Supervision;Min guard         General transfer comment: Patient educated on sequence, from low bed height  patient demonstrates poor balance during transfer due to weight shifting to his RLE, after raising the bed no loss of balance and quick transfer performed.   Ambulation/Gait Ambulation/Gait assistance: Supervision Ambulation Distance (Feet):  (200+80 without KI) Assistive device: Rolling walker (2 wheeled) Gait Pattern/deviations: Step-through pattern Gait velocity: Decreased   General Gait Details: Patient ambulated initially with KI in place, no loss of balance and appropriate mechanics noted. Patient able to complete SLRs within his range during sitting and attempted ambulation again without KI, cuing for increasing knee flexion to initiate swing phase and heel strike at initial contact. No buckling or loss of balance.    Stairs            Wheelchair Mobility    Modified Rankin (Stroke Patients Only)       Balance Overall balance assessment: Needs assistance Sitting-balance support: No upper extremity supported Sitting balance-Leahy Scale: Good     Standing balance support: Bilateral upper extremity supported Standing balance-Leahy Scale: Fair Standing balance comment: Modified DGI of 11/12 with RW                     Cognition Arousal/Alertness: Awake/alert Behavior During Therapy: WFL for tasks assessed/performed Overall Cognitive Status: Within Functional Limits for tasks assessed                      Exercises Total Joint Exercises Heel Slides: AROM;AAROM;Both;10 reps Hip ABduction/ADduction: AROM;AAROM;10 reps;Both Straight Leg Raises: AROM;Both;5 reps Long Arc Quad: AROM;AAROM;Left;10 reps Goniometric ROM: 0-68 pain limited Other Exercises Other Exercises: Assisted patient with bathroom  transfer with RW and cuing for hand placement.     General Comments General comments (skin integrity, edema, etc.): Pt reports groin pain was beginning again, though dissipated with ambulation.       Pertinent Vitals/Pain Pain Assessment:  (Pt reports  decreased pain in L thigh, pain medications provided for knee after session) Pain Location: L knee Pain Descriptors / Indicators: Aching Pain Intervention(s): Limited activity within patient's tolerance;Monitored during session;Patient requesting pain meds-RN notified;Ice applied    Home Living                      Prior Function            PT Goals (current goals can now be found in the care plan section) Acute Rehab PT Goals Patient Stated Goal: To return to playing basketball with son PT Goal Formulation: With patient Time For Goal Achievement: 10/18/15 Potential to Achieve Goals: Good Progress towards PT goals: Progressing toward goals    Frequency  BID    PT Plan Current plan remains appropriate    Co-evaluation             End of Session Equipment Utilized During Treatment: Gait belt Activity Tolerance: Patient tolerated treatment well Patient left: in bed;with call bell/phone within reach;with bed alarm set;with SCD's reapplied     Time: [id number] PT Time Calculation (min) (ACUTE ONLY): 23 min  Charges:  $Gait Training: 8-22 mins $Therapeutic Exercise: 8-22 mins $Therapeutic Activity: 8-22 mins                    G Codes:      Kerin RansomPatrick A Ona Rathert, PT, DPT    10/05/2015, 4:55 PM

## 2015-10-05 NOTE — Progress Notes (Signed)
Clinical Social Worker (CSW) received SNF consult. PT is recommending home health. RN Case Manager is aware of above. Please reconsult if future social work needs arise. CSW signing off.   Eyob Godlewski Morgan, LCSWA [phone] 

## 2015-10-05 NOTE — Progress Notes (Signed)
   Subjective: 1 Day Post-Op Procedure(s) (LRB): TOTAL KNEE ARTHROPLASTY (Left) Patient reports pain as mild.   Patient is well, and has had no acute complaints or problems We will start therapy today.  Plan is to go Home after hospital stay.  Objective: Vital signs in last 24 hours: Temp:  [97.5 F (36.4 C)-100.5 F (38.1 C)] 98.8 F (37.1 C) (11/30 0723) Pulse Rate:  [55-83] 70 (11/30 0723) Resp:  [12-19] 18 (11/30 0723) BP: (90-136)/(51-80) 136/71 mmHg (11/30 0723) SpO2:  [97 %-100 %] 98 % (11/30 0723) FiO2 (%):  [21 %] 21 % (11/29 1102) Weight:  [110.133 kg (242 lb 12.8 oz)] 110.133 kg (242 lb 12.8 oz) (11/29 1105)  Intake/Output from previous day: 11/29 0701 - 11/30 0700 In: 3537.5 [P.O.:1000; I.V.:2537.5] Out: 2750 [Urine:2700; Blood:50] Intake/Output this shift: Total I/O In: 131.3 [I.V.:131.3] Out: -    Recent Labs  10/04/15 1144 10/05/15 0634  HGB 12.5* 11.4*    Recent Labs  10/04/15 1144 10/05/15 0634  WBC 6.2 8.6  RBC 4.62 4.26*  HCT 38.3* 34.9*  PLT 231 207    Recent Labs  10/04/15 1144 10/05/15 0634  NA  --  134*  K  --  3.7  CL  --  105  CO2  --  24  BUN  --  10  CREATININE 1.11 0.99  GLUCOSE  --  120*  CALCIUM  --  8.0*   No results for input(s): LABPT, INR in the last 72 hours.  EXAM General - Patient is Alert, Appropriate and Oriented Extremity - Neurovascular intact Sensation intact distally Intact pulses distally Dorsiflexion/Plantar flexion intact Dressing - dressing C/D/I and no drainage Motor Function - intact, moving foot and toes well on exam.   Past Medical History  Diagnosis Date  . Hypertension   . GERD (gastroesophageal reflux disease)   . Arthritis     Assessment/Plan:   1 Day Post-Op Procedure(s) (LRB): TOTAL KNEE ARTHROPLASTY (Left) Active Problems:   Primary osteoarthritis of knee  Estimated body mass index is 30.35 kg/(m^2) as calculated from the following:   Height as of this encounter: 6\' 3"   (1.905 m).   Weight as of this encounter: 110.133 kg (242 lb 12.8 oz). Advance diet Up with therapy Needs BM Recheck labs in the am  DVT Prophylaxis - Lovenox, Foot Pumps and TED hose Weight-Bearing as tolerated to left leg D/C O2 and Pulse OX and try on Room Air  T. Cranston Neighborhris Gaines, PA-C [facility] 10/05/2015, 7:58 AM

## 2015-10-05 NOTE — Progress Notes (Signed)
OT Cancellation Note  Patient Details Name: Erasmo LeventhalWilliam Buhl MRN: [id number] DOB: 07/07/1956   Cancelled Treatment:    Reason Eval/Treat Not Completed: Other (comment). Patient had recent knee replacement in September and did well and is doing well with Physical Therapy. Patient knows the drill. Will be available if needed.  Ocie CornfieldHuff, Emyah Roznowski M 10/05/2015, 9:36 AM

## 2015-10-06 ENCOUNTER — Encounter: Payer: Self-pay | Admitting: Orthopedic Surgery

## 2015-10-06 LAB — BASIC METABOLIC PANEL
Anion gap: 9 (ref 5–15)
BUN: 8 mg/dL (ref 6–20)
CALCIUM: 8.5 mg/dL — AB (ref 8.9–10.3)
CHLORIDE: 102 mmol/L (ref 101–111)
CO2: 24 mmol/L (ref 22–32)
CREATININE: 1.09 mg/dL (ref 0.61–1.24)
GFR calc non Af Amer: >60 mL/min (ref >60)
Glucose, Bld: 134 mg/dL — ABNORMAL HIGH (ref 65–99)
Potassium: 4 mmol/L (ref 3.5–5.1)
SODIUM: 135 mmol/L (ref 135–145)

## 2015-10-06 LAB — CBC
HCT: 36.8 % — ABNORMAL LOW (ref 40.0–52.0)
HEMOGLOBIN: 12 g/dL — AB (ref 13.0–18.0)
MCH: 27 pg (ref 26.0–34.0)
MCHC: 32.7 g/dL (ref 32.0–36.0)
MCV: 82.7 fL (ref 80.0–100.0)
Platelets: 223 10*3/uL (ref 150–440)
RBC: 4.45 MIL/uL (ref 4.40–5.90)
RDW: 13.6 % (ref 11.5–14.5)
WBC: 12.6 10*3/uL — AB (ref 3.8–10.6)

## 2015-10-06 LAB — SURGICAL PATHOLOGY

## 2015-10-06 MED ORDER — FLEET ENEMA 7-19 GM/118ML RE ENEM: 1.0000 | ENEMA | Freq: Every day | RECTAL | Status: DC | PRN

## 2015-10-06 MED ORDER — OXYCODONE HCL 5 MG PO TABS: 5.0000 mg | ORAL_TABLET | ORAL | Status: AC | PRN

## 2015-10-06 MED ORDER — TRAMADOL HCL 50 MG PO TABS: 50.0000 mg | ORAL_TABLET | Freq: Four times a day (QID) | ORAL | Status: AC | PRN

## 2015-10-06 MED ORDER — ONDANSETRON HCL 4 MG PO TABS: 4.0000 mg | ORAL_TABLET | Freq: Four times a day (QID) | ORAL | Status: AC | PRN

## 2015-10-06 MED ORDER — ENOXAPARIN SODIUM 40 MG/0.4ML ~~LOC~~ SOLN: 30.0000 mg | SUBCUTANEOUS | Status: AC

## 2015-10-06 NOTE — Discharge Instructions (Signed)

## 2015-10-06 NOTE — Progress Notes (Signed)
Order to discharge patient to home today with out patient therapy. Discharge instructions given per MD order, home and new medications reviewed with patient, Lovenox teaching completed with patient, RX's slip given, patient verbalized understanding of discharge instructions given. Spouse to take patient home. Extra honeycomb dressing given to patient at time of discharge.  Will be discharge via wheelchair- with nurse tech.

## 2015-10-06 NOTE — Care Management (Signed)
Patient refused home health PT. PT is recommending OPPT. He denies any other RNCM needs. Case closed.

## 2015-10-06 NOTE — Discharge Summary (Signed)
Physician Discharge Summary  Patient ID: Johnathan Salas MRN: [id number] DOB/AGE: 60/01/1956 60 y.o.  Admit date: 10/04/2015 Discharge date: 10/06/2015  Admission Diagnoses:  PRIMARY OSTEOARTHRITIS   Discharge Diagnoses: Patient Active Problem List   Diagnosis Date Noted  . Primary osteoarthritis of knee 07/12/2015  . Hypotension 07/12/2015    Past Medical History  Diagnosis Date  . Hypertension   . GERD (gastroesophageal reflux disease)   . Arthritis      Transfusion: none   Consultants (if any):    Discharged Condition: Improved  Hospital Course: Johnathan LeventhalWilliam Grimm is an 59 y.o. male who was admitted 10/04/2015 with a diagnosis of left knee OA and went to the operating room on 10/04/2015 and underwent the above named procedures.    Surgeries: Procedure(s): TOTAL KNEE ARTHROPLASTY on 10/04/2015 Patient tolerated the surgery well. Taken to PACU where she was stabilized and then transferred to the orthopedic floor.  Started on Lovenox 30 q 12 hrs. Foot pumps applied bilaterally at 80 mm. Heels elevated on bed with rolled towels. No evidence of DVT. Negative Homan. Physical therapy started on day #1 for gait training and transfer. OT started day #1 for ADL and assisted devices.  Patient's foley was d/c on day #1. Patient's IV and hemovac was d/c on day #2.  On post op day #3 patient was stable and ready for discharge to home with home health.  Implants: Medacta GMK sphere system, left 6 femur, left 5 tibia with a 10 mm insert, 4 patella, all components cemented  He was given perioperative antibiotics:  Anti-infectives    Start     Dose/Rate Route Frequency Ordered Stop   10/04/15 1200  ceFAZolin (ANCEF) IVPB 2 g/50 mL premix     2 g 100 mL/hr over 30 Minutes Intravenous Every 6 hours 10/04/15 1101 10/04/15 2343   10/04/15 0615  ceFAZolin (ANCEF) IVPB 2 g/50 mL premix  Status:  Discontinued     2 g 100 mL/hr over 30 Minutes Intravenous  Once 10/04/15 0605 10/04/15 1056   10/04/15 0611  ceFAZolin (ANCEF) 2-3 GM-% IVPB SOLR    Comments:  KENNEDY, ASHLEY: cabinet override      10/04/15 0611 10/04/15 0741    .  He was given sequential compression devices, early ambulation, and lovenox for DVT prophylaxis.  He benefited maximally from the hospital stay and there were no complications.    Recent vital signs:  Filed Vitals:   10/06/15 0351 10/06/15 0614  BP: 138/78 98/63  Pulse: 95   Temp: 100.2 F (37.9 C)   Resp: 18     Recent laboratory studies:  Lab Results  Component Value Date   HGB 12.0* 10/06/2015   HGB 11.4* 10/05/2015   HGB 12.5* 10/04/2015   Lab Results  Component Value Date   WBC 12.6* 10/06/2015   PLT 223 10/06/2015   Lab Results  Component Value Date   INR 1.06 09/21/2015   Lab Results  Component Value Date   NA 135 10/06/2015   K 4.0 10/06/2015   CL 102 10/06/2015   CO2 24 10/06/2015   BUN 8 10/06/2015   CREATININE 1.09 10/06/2015   GLUCOSE 134* 10/06/2015    Discharge Medications:     Medication List    STOP taking these medications        amLODipine 10 MG tablet  Commonly known as:  NORVASC      TAKE these medications        celecoxib 200 MG capsule  Commonly known  as:  CELEBREX  Take 200 mg by mouth 2 (two) times daily.     enoxaparin 40 MG/0.4ML injection  Commonly known as:  LOVENOX  Inject 0.3 mLs (30 mg total) into the skin daily.     ondansetron 4 MG tablet  Commonly known as:  ZOFRAN  Take 1 tablet (4 mg total) by mouth every 6 (six) hours as needed for nausea.     oxyCODONE 5 MG immediate release tablet  Commonly known as:  Oxy IR/ROXICODONE  Take 5 mg by mouth every 4 (four) hours as needed for severe pain.     oxyCODONE 5 MG immediate release tablet  Commonly known as:  Oxy IR/ROXICODONE  Take 1-2 tablets (5-10 mg total) by mouth every 3 (three) hours as needed for breakthrough pain.     traMADol 50 MG tablet  Commonly known as:  ULTRAM  Take 50 mg by mouth every 6 (six) hours as  needed.     traMADol 50 MG tablet  Commonly known as:  ULTRAM  Take 1 tablet (50 mg total) by mouth every 6 (six) hours as needed (mild pain).        Diagnostic Studies: Dg Knee 1-2 Views Left  11-02-15  CLINICAL DATA:  Status post total knee replacement EXAM: LEFT KNEE - 1-2 VIEW COMPARISON:  CT left knee August 29, 2015 FINDINGS: Frontal and lateral views were obtained. Patient is status post total knee arthroplasty on the left with femoral and tibial prosthetic components appearing well-seated. No acute fracture or dislocation. Air within the joint is an expected postoperative finding. IMPRESSION: Prosthetic components appear well seated. No acute fracture or dislocation. Electronically Signed   By: Bretta Bang III M.D.   On: 11-02-15 10:41    Disposition: 06-Home-Health Care Svc        Follow-up Information    Follow up with MENZ,MICHAEL, MD In 2 weeks.   Specialty:  Orthopedic Surgery   Why:  For staple removal and skin check   Contact information:   [address] [facility] [place] [address] [phone]        Signed: Amador Cunas [facility] 10/06/2015, 7:26 AM

## 2015-10-06 NOTE — Progress Notes (Signed)
Physical Therapy Treatment Patient Details Name: Johnathan Salas MRN: [id number] DOB: Mar 22, 1956 Today's Date: 10/06/2015    History of Present Illness Pt underwent L TKR and is POD#0 at time of evaluation. No reported post-op complications. Pt recently underwent R TKR 07/2015.     PT Comments    Patient demonstrates improved balance during gait and is able to complete stair training today, though he demonstrates poor single leg balance on stair ascent with RW. Patient educated to have spotter with him at home to ascend step (which is not as high as step utilized in this session). Patient has now met all PT mobility goals for discharge when medically appropriate. Pt continues to struggle with supine to sit transfer, though patient will be sleeping on a long couch while at home. Pt would continue to benefit from skilled PT services to address his mobility deficits listed above.   Follow Up Recommendations  Outpatient PT     Equipment Recommendations  None recommended by PT    Recommendations for Other Services       Precautions / Restrictions Precautions Precautions: Knee Precaution Booklet Issued: Yes (comment) Required Braces or Orthoses: Knee Immobilizer - Left Knee Immobilizer - Left: Discontinue once straight leg raise with < 10 degree lag Restrictions Weight Bearing Restrictions: Yes LLE Weight Bearing: Weight bearing as tolerated    Mobility  Bed Mobility Overal bed mobility: Needs Assistance Bed Mobility: Sit to Supine;Supine to Sit     Supine to sit: Min guard;Min assist Sit to supine: Independent   General bed mobility comments: Patient is able to hook his RLE and negotiate return to supine with no trouble. PT initially attempted to instruct patient in use of towel underneath LLE to transfer to sitting, patient experienced significant pain as LLE began to dangle and leaned heavily posteriorly to bring LLE back on to bed. On second attempt PT provided min guard-min A to  bring LLE to the ground, minimal balance deficits noted with this technique.   Transfers Overall transfer level: Needs assistance Equipment used: Rolling walker (2 wheeled) Transfers: Sit to/from Stand Sit to Stand: Supervision;Min guard         General transfer comment: Patient demonstrates improved sequence and hand placement, appropriate weight shifting throughout transfer.   Ambulation/Gait Ambulation/Gait assistance: Supervision Ambulation Distance (Feet): 150 Feet Assistive device: Rolling walker (2 wheeled) Gait Pattern/deviations: Step-through pattern;Antalgic   Gait velocity interpretation: Below normal speed for age/gender General Gait Details: Patient demonstrates increased knee flexion to initiate swing phase with prolonged gait on this session. No loss of balance noted and improved gait speed from previous sessions.    Stairs Stairs: Yes Stairs assistance: Min guard Stair Management: No rails Number of Stairs:  (1+1) General stair comments: Patient educated on transfer, mild loss of balance during ascent of step, no balance on descent of step on 1st attempt. Second attempt to higher step patient demonstrated improved balance with ascent, though still recommended to have support for ascending steps at home.   Wheelchair Mobility    Modified Rankin (Stroke Patients Only)       Balance Overall balance assessment: Needs assistance Sitting-balance support: No upper extremity supported Sitting balance-Leahy Scale: Good Sitting balance - Comments: In supine to sit transfer patient continues to lose balance secondary to increase in pain and difficulty maintaining LLE in dangling. Once in sitting, no loss of balance.    Standing balance support: Bilateral upper extremity supported Standing balance-Leahy Scale: Fair  Cognition                            Exercises Total Joint Exercises Straight Leg Raises: AROM;Both;10  reps Long Arc Quad: AROM;AAROM;Left;10 reps Goniometric ROM: 0-75 Other Exercises Other Exercises: Transferred patient to bathroom with RW, supervision level assist for transfer and hand placement.     General Comments General comments (skin integrity, edema, etc.): PT noted fresh blood at the end of the session at distal portion of incision, notified RN.       Pertinent Vitals/Pain Pain Assessment:  (During session patient complained of L knee pain during supine to sit transfer, which resolved with soft tissue mobilization. PT inspected dressing prior to leaving and noticed fresh blood, informed RN.) Pain Location: L knee  Pain Descriptors / Indicators: Aching Pain Intervention(s): Limited activity within patient's tolerance;Monitored during session;Premedicated before session;Ice applied    Home Living                      Prior Function            PT Goals (current goals can now be found in the care plan section) Acute Rehab PT Goals Patient Stated Goal: To return to playing basketball with son PT Goal Formulation: With patient Time For Goal Achievement: 10/18/15 Potential to Achieve Goals: Good Progress towards PT goals: Progressing toward goals    Frequency  BID    PT Plan Current plan remains appropriate    Co-evaluation             End of Session Equipment Utilized During Treatment: Gait belt Activity Tolerance: Patient tolerated treatment well Patient left: in bed;with call bell/phone within reach;with bed alarm set;with SCD's reapplied     Time: 4239-5320 PT Time Calculation (min) (ACUTE ONLY): 28 min  Charges:  $Gait Training: 8-22 mins $Therapeutic Activity: 8-22 mins                    G Codes:      Kerman Passey, PT, DPT    10/06/2015, 10:42 AM

## 2015-10-06 NOTE — Progress Notes (Signed)
   Subjective: 2 Days Post-Op Procedure(s) (LRB): TOTAL KNEE ARTHROPLASTY (Left) Patient reports pain as mild.   Patient is well, and has had no acute complaints or problems We will continue therapy today.  Plan is to go Home after hospital stay.  Objective: Vital signs in last 24 hours: Temp:  [97.5 F (36.4 C)-100.2 F (37.9 C)] 100.2 F (37.9 C) (12/01 0351) Pulse Rate:  [70-95] 95 (12/01 0351) Resp:  [18] 18 (12/01 0351) BP: (98-153)/(62-78) 98/63 mmHg (12/01 0614) SpO2:  [97 %-99 %] 97 % (12/01 0351)  Intake/Output from previous day: 11/30 0701 - 12/01 0700 In: 1211.3 [P.O.:1080; I.V.:131.3] Out: 2135 [Urine:2135] Intake/Output this shift:     Recent Labs  10/04/15 1144 10/05/15 0634 10/06/15 0607  HGB 12.5* 11.4* 12.0*    Recent Labs  10/05/15 0634 10/06/15 0607  WBC 8.6 12.6*  RBC 4.26* 4.45  HCT 34.9* 36.8*  PLT 207 223    Recent Labs  10/05/15 0634 10/06/15 0607  NA 134* 135  K 3.7 4.0  CL 105 102  CO2 24 24  BUN 10 8  CREATININE 0.99 1.09  GLUCOSE 120* 134*  CALCIUM 8.0* 8.5*   No results for input(s): LABPT, INR in the last 72 hours.  EXAM General - Patient is Alert, Appropriate and Oriented Extremity - Neurovascular intact Sensation intact distally Intact pulses distally Dorsiflexion/Plantar flexion intact Dressing - dressing C/D/I and no drainage, dressing changed Motor Function - intact, moving foot and toes well on exam.   Past Medical History  Diagnosis Date  . Hypertension   . GERD (gastroesophageal reflux disease)   . Arthritis     Assessment/Plan:   2 Days Post-Op Procedure(s) (LRB): TOTAL KNEE ARTHROPLASTY (Left) Active Problems:   Primary osteoarthritis of knee  Estimated body mass index is 30.35 kg/(m^2) as calculated from the following:   Height as of this encounter: 6\' 3"  (1.905 m).   Weight as of this encounter: 110.133 kg (242 lb 12.8 oz). Advance diet Up with therapy Needs BM Discharge home today  pending BM  DVT Prophylaxis - Lovenox, Foot Pumps and TED hose Weight-Bearing as tolerated to left leg D/C O2 and Pulse OX and try on Room Air  T. Cranston Neighborhris Gaines, PA-C [facility] 10/06/2015, 7:23 AM

## 2015-10-07 NOTE — Addendum Note (Signed)
Addendum  created 10/07/15 1108 by Lezlie OctaveGijsbertus F Van Staveren, MD   Modules edited: Anesthesia Events, Narrator   Narrator:  Narrator: Event Log Edited

## 2015-10-11 NOTE — Care Management (Signed)
Post discharge: Received call from Dr. Rosita KeaMenz office requesting information related to patient never receiving home health PT. Per this RNCM note and another RNCM note patient refused HHPT and was planning OPPT which would have been arranged with MD.

## 2015-12-15 IMAGING — CR DG KNEE 1-2V*R*
1 series · 2 of 2 positions shown · non-contrast
Comparison: CT right knee June 02, 2015

CLINICAL DATA: Status post total knee replacement

EXAM:
RIGHT KNEE - 1-2 VIEW

[Series 1: ap · 0.17mm/px · 2 of 2 slices shown]
[im 1/2]
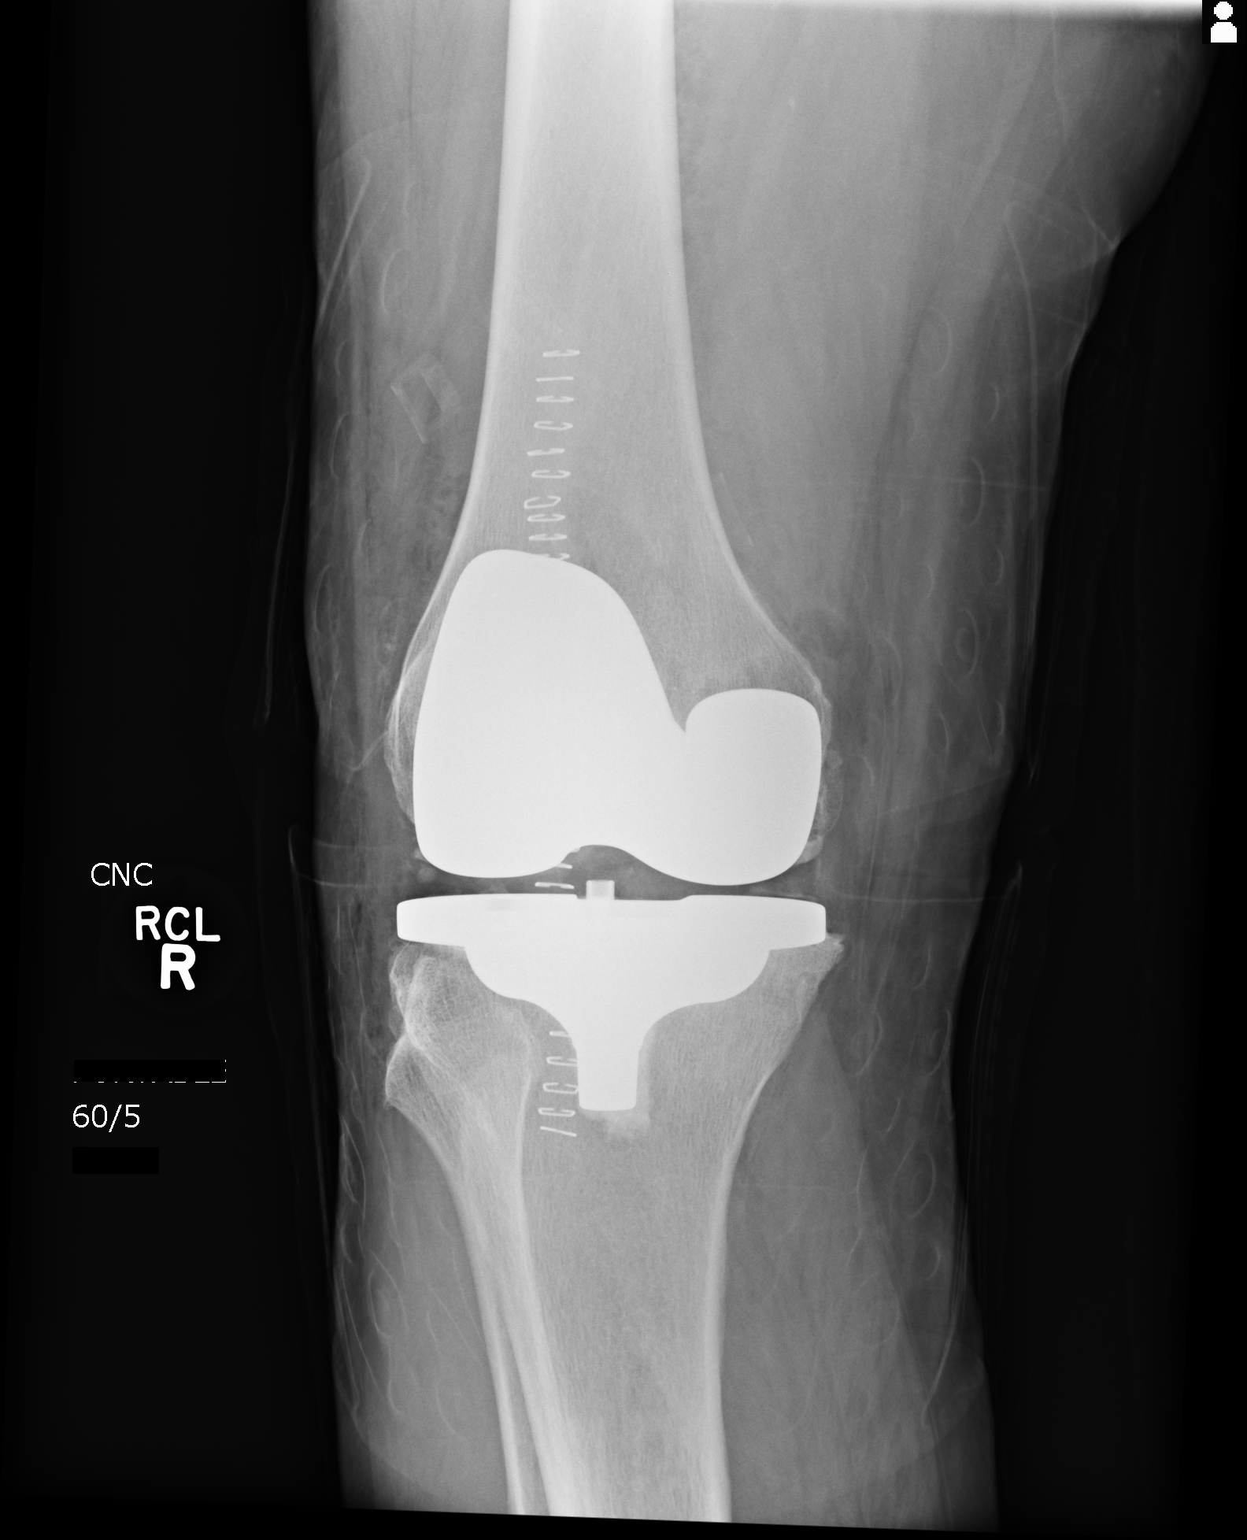
[im 2/2]
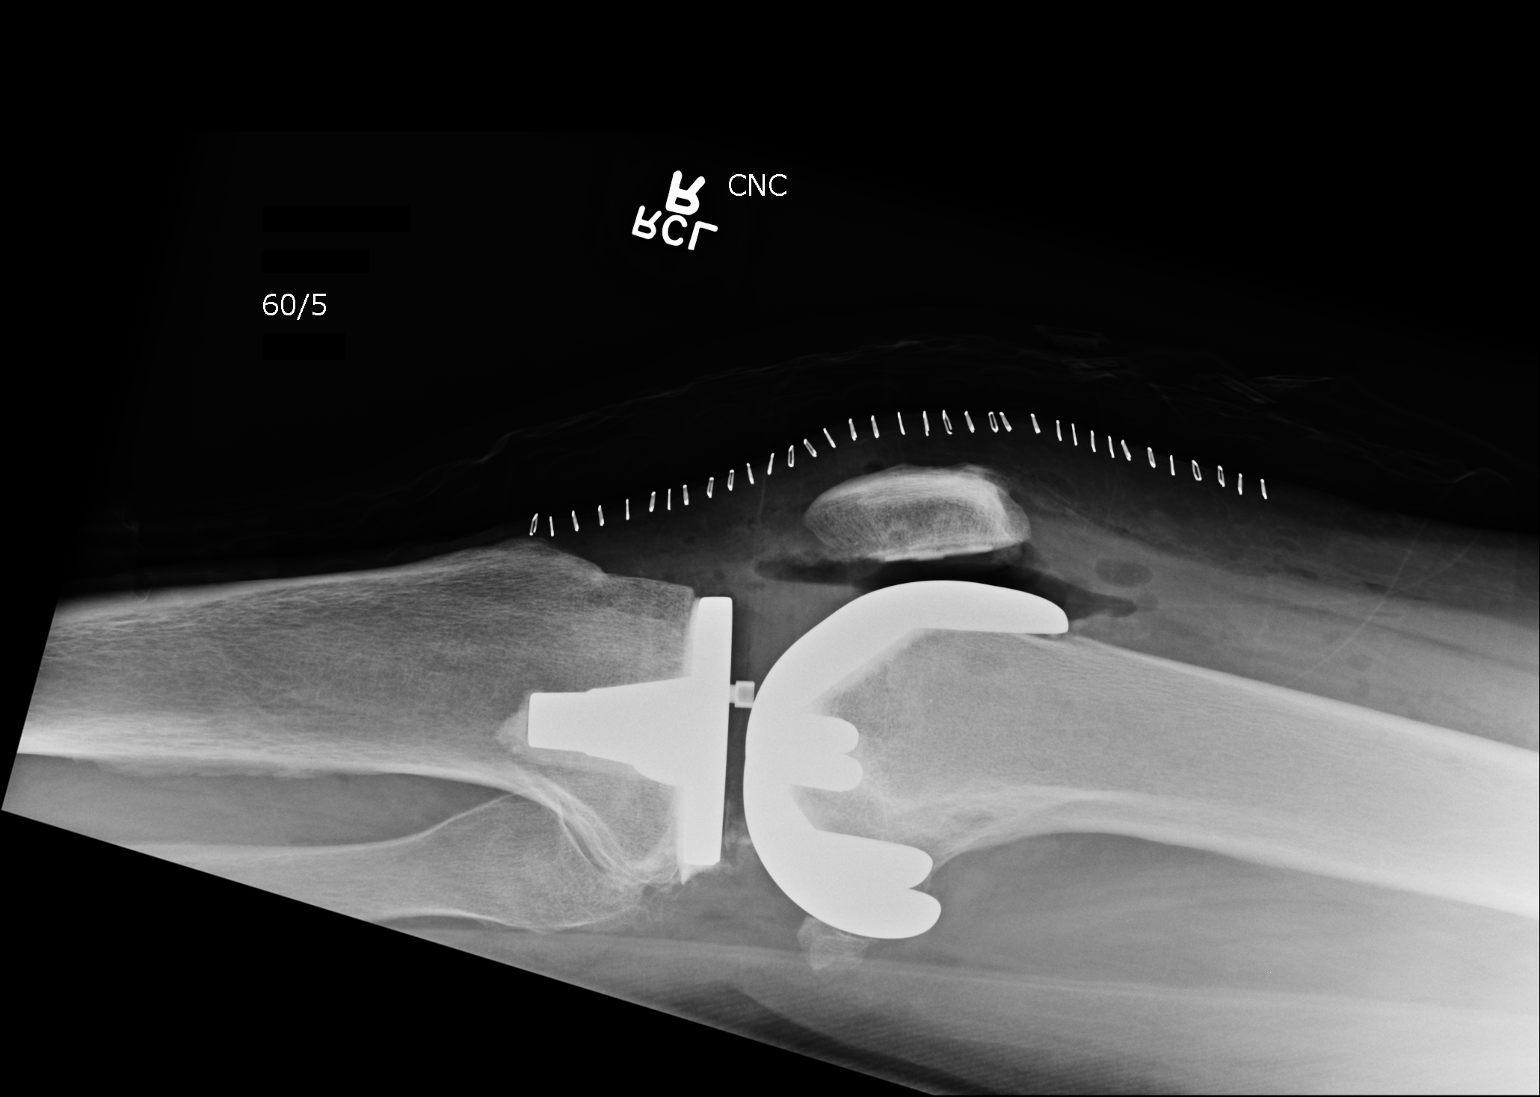

[2 of 2 positions shown; findings below may reference images not displayed]

FINDINGS: Frontal and lateral views were obtained. Patient is status post
total knee replacement with femoral and tibial prosthetic components
appearing well-seated. No fracture or dislocation. Air within the
joint is an expected postoperative finding.
IMPRESSION: Prosthetic components appear well seated. No acute fracture or
dislocation.

## 2016-02-01 IMAGING — CT CT KNEE*L* W/O CM
3 of 8 series · 10 of 33 positions shown, 12 images · non-contrast
Comparison: None.

CLINICAL DATA: Left knee pain.  Preop evaluation.

EXAM:
CT OF THE LEFT KNEE WITHOUT CONTRAST
TECHNIQUE: Multidetector CT imaging of the LEFT knee was performed according to
the standard protocol. Multiplanar CT image reconstructions were
also generated.

[Series 4: knee · axial · 0.32mm/px · z∈[+537,+741]mm · 4 of 342 slices shown, 5 images]
[im 69/342  soft-tissue]
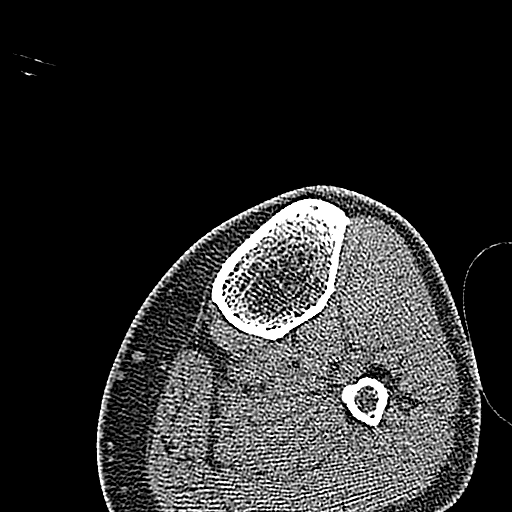
[im 69/342  bone]
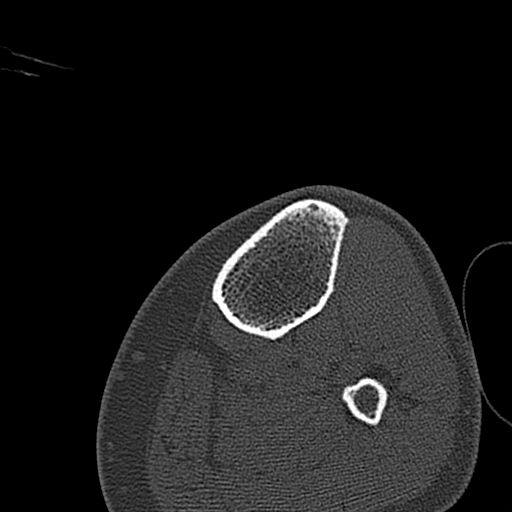
[im 137/342  bone]
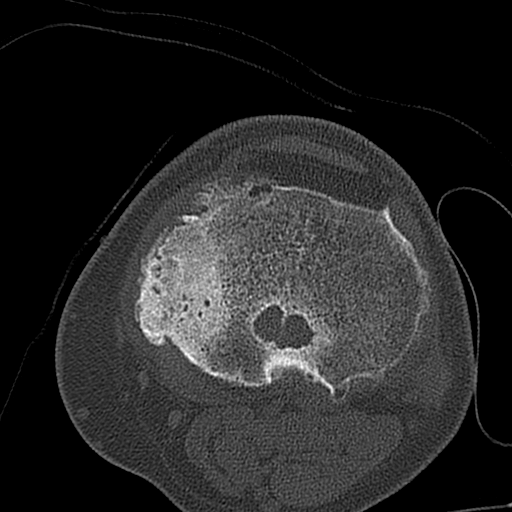
[im 205/342  bone]
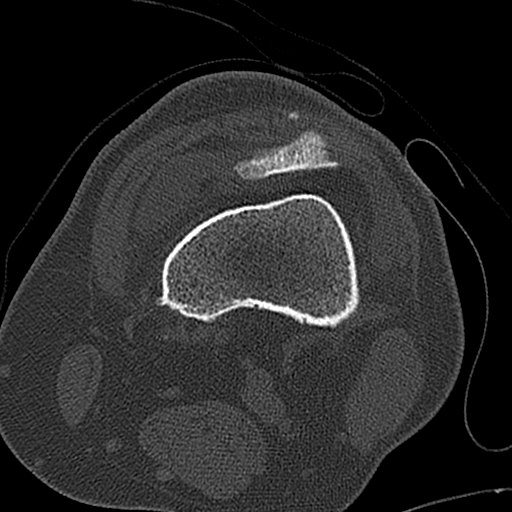
[im 273/342  bone]
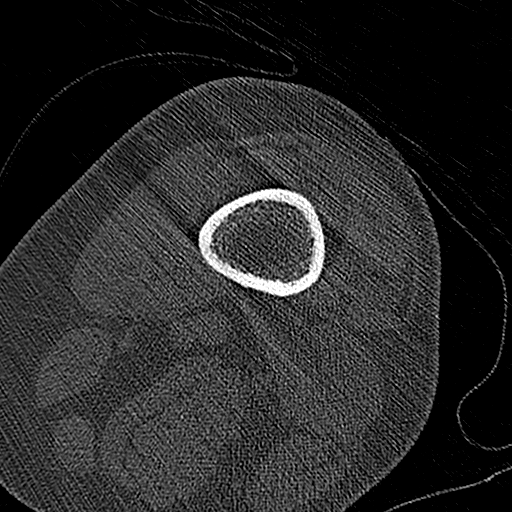

[Series 8: knee bone coronal · coronal · 0.27mm/px · 1 of 61 slices shown]
[im 31/61  bone]
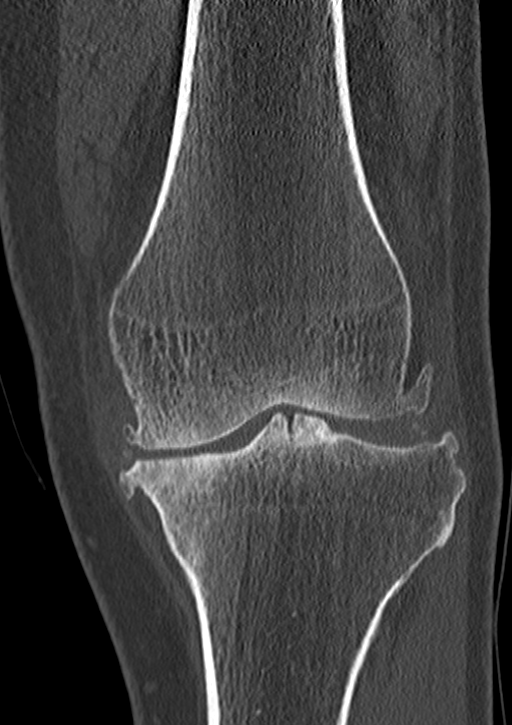

[Series 13: knee soft tissue sagittal · sagittal · 0.36mm/px · 5 of 80 slices shown, 6 images]
[im 27/80  bone]
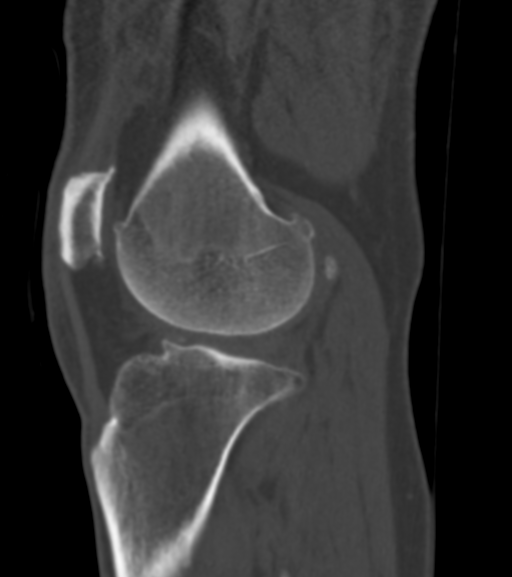
[im 33/80  bone]
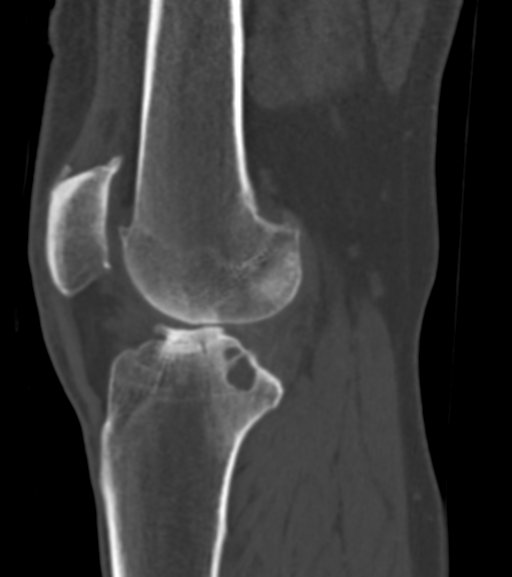
[im 40/80  soft-tissue]
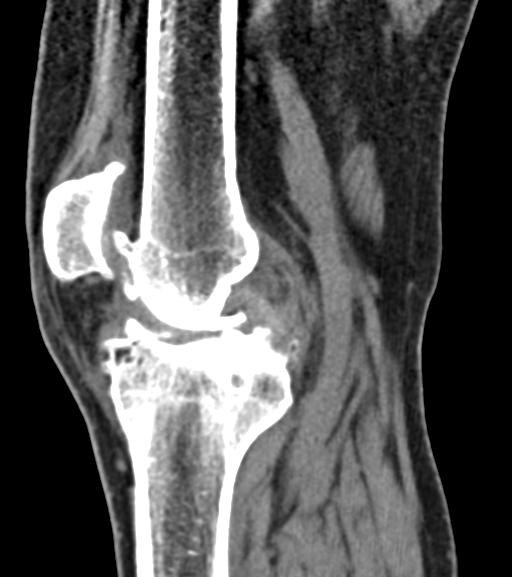
[im 40/80  bone]
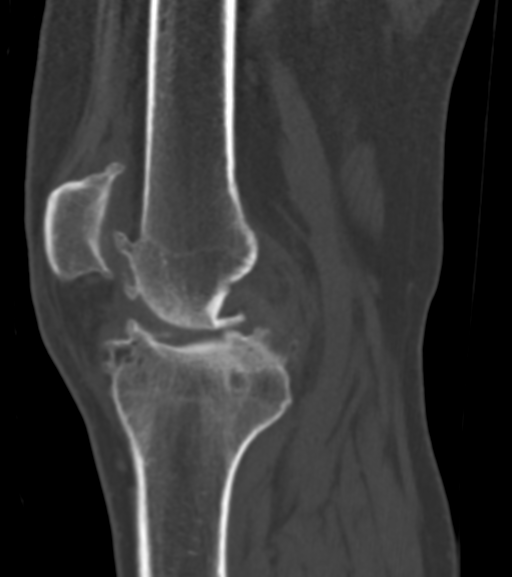
[im 47/80  bone]
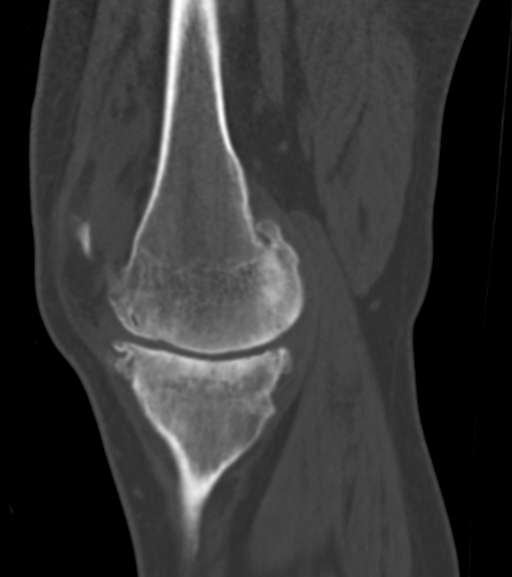
[im 53/80  bone]
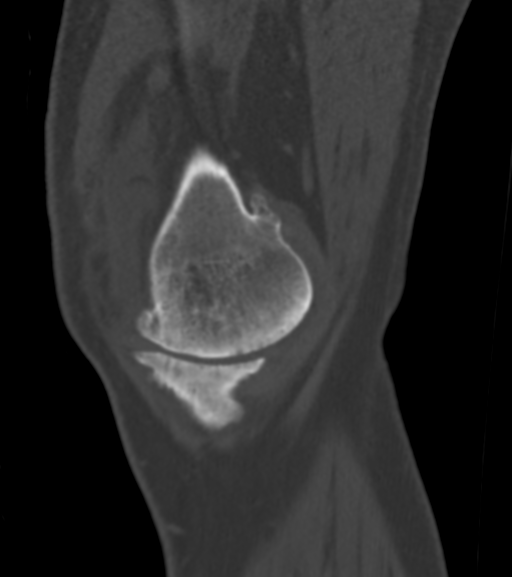

[10 of 33 positions shown; findings below may reference images not displayed]

FINDINGS: The hip demonstrates no fracture or dislocation. There is no lytic
or blastic lesion.

The knee demonstrates no fracture or dislocation. There is no lytic
or blastic lesion. There is severe tricompartmental osteoarthritis
of the left knee most severe in the medial femorotibial compartment
with subchondral sclerosis, subchondral cystic changes and marginal
osteophytosis. There is chondrocalcinosis of the medial and lateral
femorotibial compartment as can be seen with CPPD. There is not a
intraosseous scratch at there is an intraosseous ganglion cyst at
the PCL insertion. There is a small joint effusion.

The ankle demonstrates no fracture or dislocation. There is no lytic
or blastic lesion.
IMPRESSION: Severe tricompartmental osteoarthritis of the left knee.

## 2016-03-08 IMAGING — CR DG KNEE 1-2V*L*
1 series · 2 of 2 positions shown · non-contrast
Comparison: CT left knee August 29, 2015

CLINICAL DATA: Status post total knee replacement

EXAM:
LEFT KNEE - 1-2 VIEW

[Series 1: ap · 0.17mm/px · 2 of 2 slices shown]
[im 1/2]
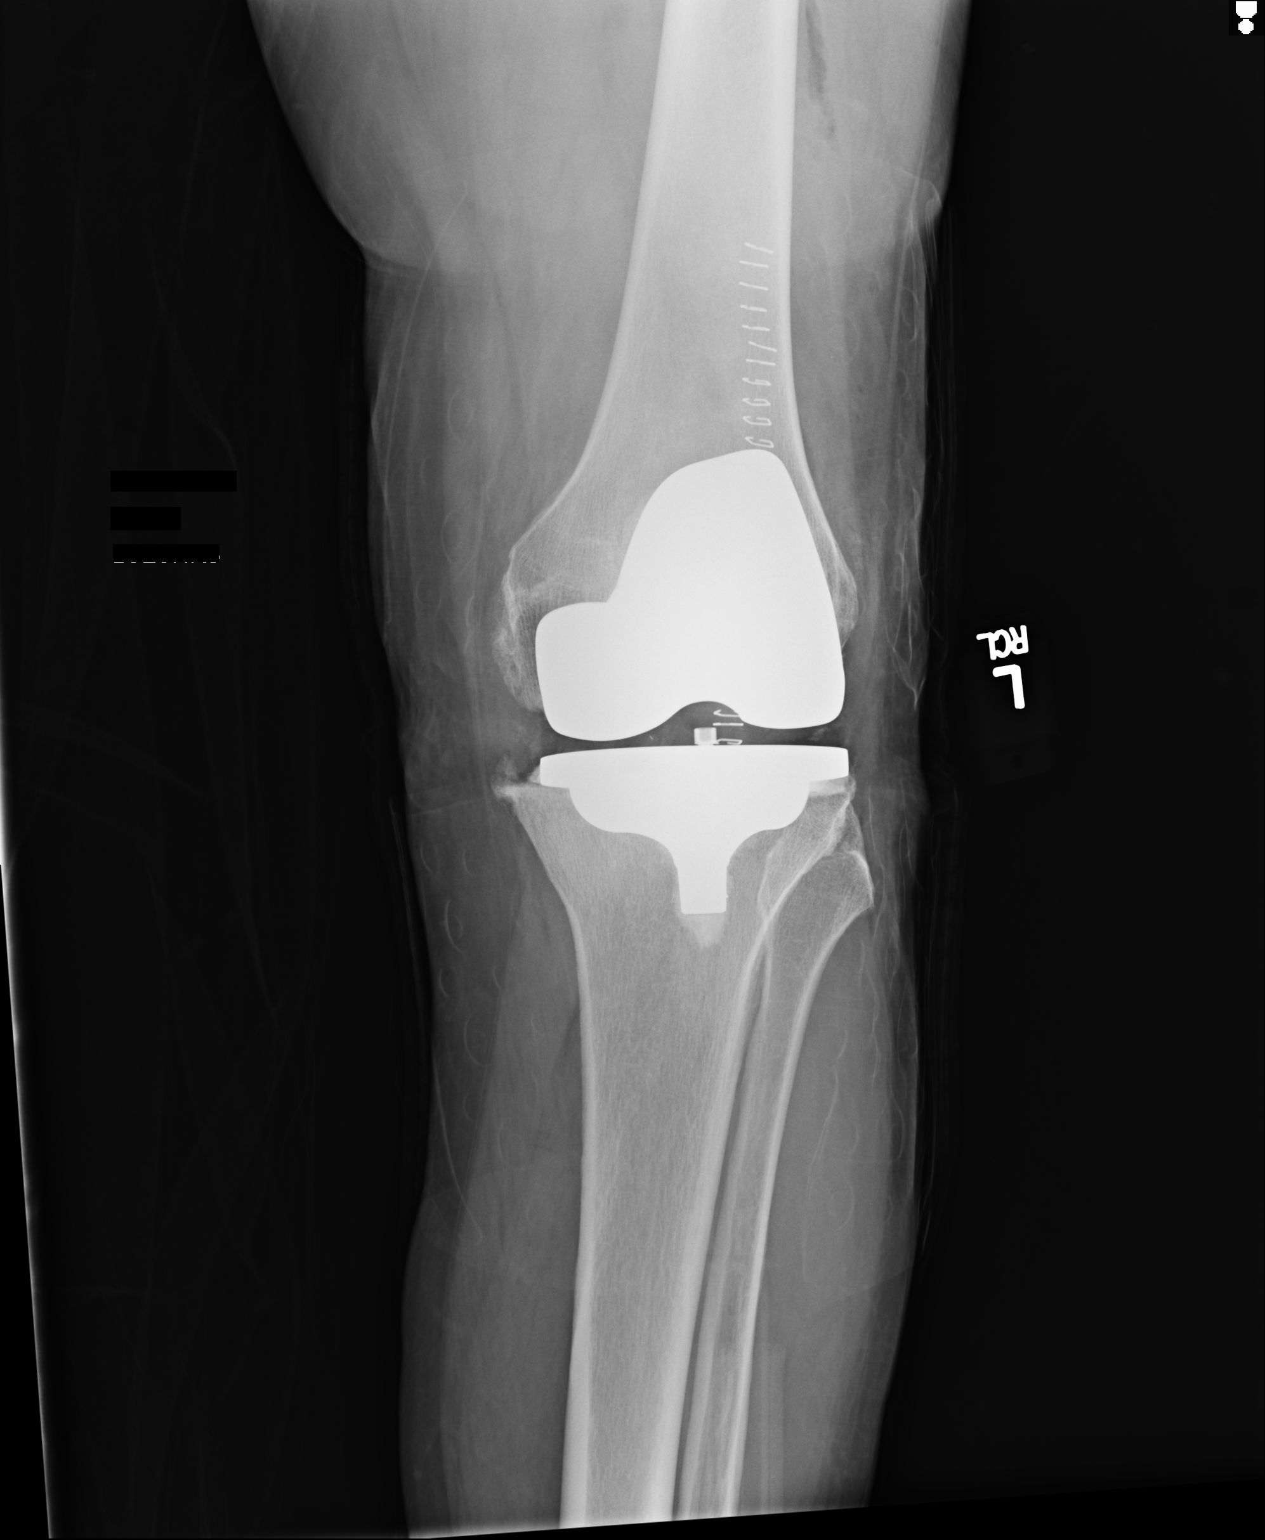
[im 2/2]
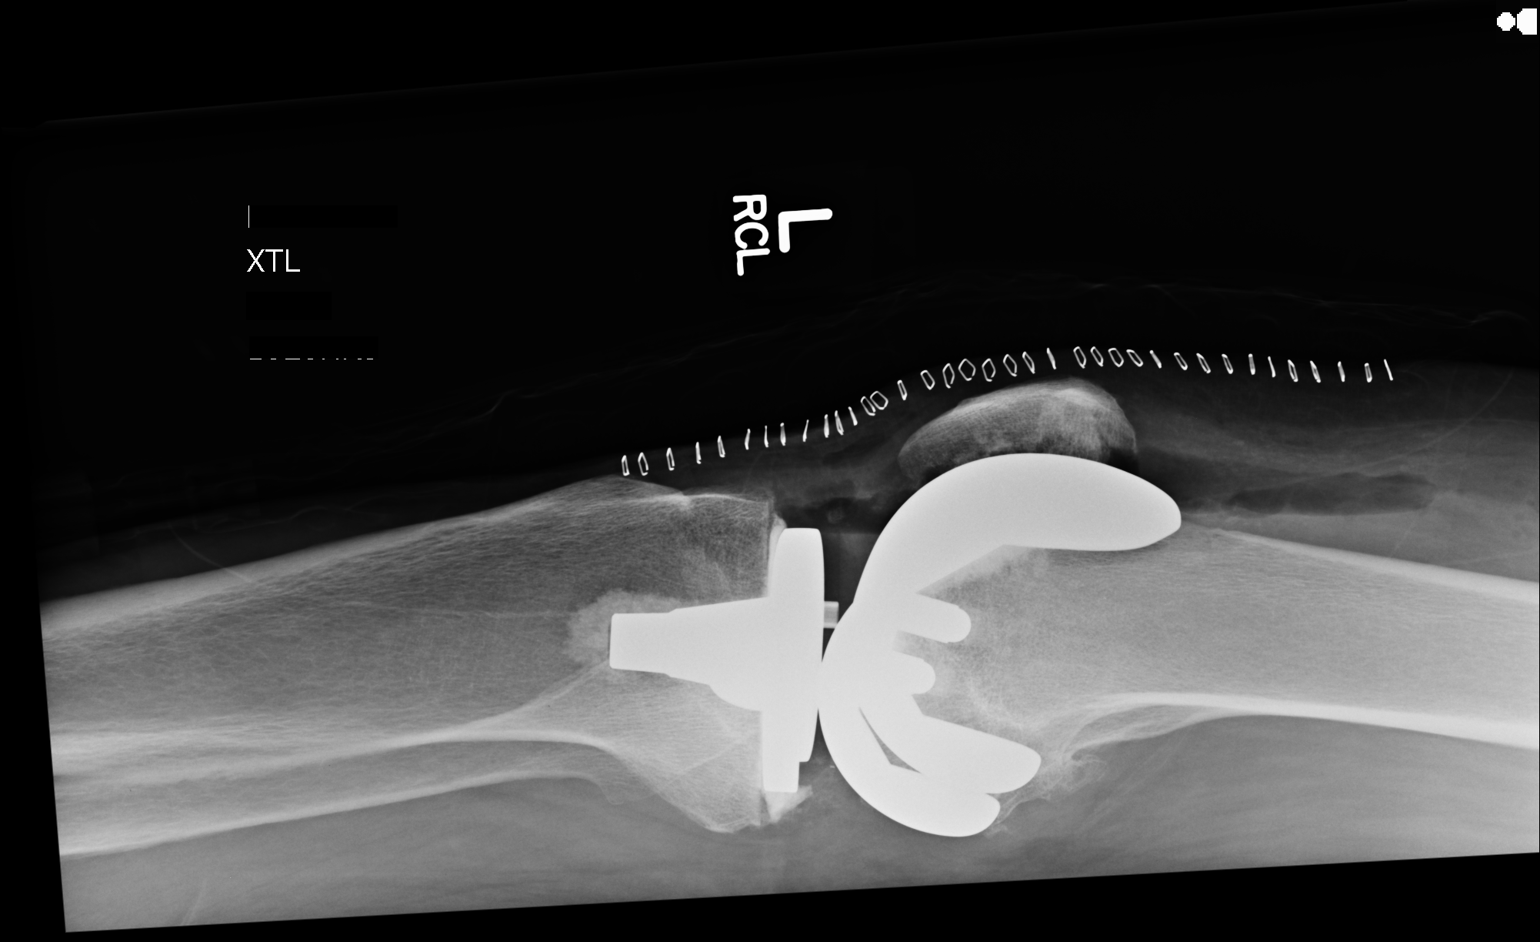

[2 of 2 positions shown; findings below may reference images not displayed]

FINDINGS: Frontal and lateral views were obtained. Patient is status post
total knee arthroplasty on the left with femoral and tibial
prosthetic components appearing well-seated. No acute fracture or
dislocation. Air within the joint is an expected postoperative
finding.
IMPRESSION: Prosthetic components appear well seated. No acute fracture or
dislocation.
